# Patient Record
Sex: Female | Born: 1955 | Race: White | Hispanic: No | Marital: Married | Smoking: Never smoker
Health system: Southern US, Community
[De-identification: ages and names within clinical notes are randomized; demographics above are authoritative.]

## PROBLEM LIST (undated history)

## (undated) DIAGNOSIS — C44221 Squamous cell carcinoma of skin of unspecified ear and external auricular canal: Secondary | ICD-10-CM

## (undated) DIAGNOSIS — T8859XA Other complications of anesthesia, initial encounter: Secondary | ICD-10-CM

## (undated) DIAGNOSIS — Z9889 Other specified postprocedural states: Secondary | ICD-10-CM

## (undated) DIAGNOSIS — R51 Headache: Secondary | ICD-10-CM

## (undated) DIAGNOSIS — K219 Gastro-esophageal reflux disease without esophagitis: Secondary | ICD-10-CM

## (undated) DIAGNOSIS — T4145XA Adverse effect of unspecified anesthetic, initial encounter: Secondary | ICD-10-CM

## (undated) DIAGNOSIS — F988 Other specified behavioral and emotional disorders with onset usually occurring in childhood and adolescence: Secondary | ICD-10-CM

## (undated) DIAGNOSIS — J45909 Unspecified asthma, uncomplicated: Secondary | ICD-10-CM

## (undated) DIAGNOSIS — H269 Unspecified cataract: Secondary | ICD-10-CM

## (undated) DIAGNOSIS — F32A Depression, unspecified: Secondary | ICD-10-CM

## (undated) DIAGNOSIS — K635 Polyp of colon: Secondary | ICD-10-CM

## (undated) DIAGNOSIS — I10 Essential (primary) hypertension: Secondary | ICD-10-CM

## (undated) DIAGNOSIS — F329 Major depressive disorder, single episode, unspecified: Secondary | ICD-10-CM

## (undated) DIAGNOSIS — R112 Nausea with vomiting, unspecified: Secondary | ICD-10-CM

## (undated) HISTORY — PX: MOHS SURGERY: SUR867

## (undated) HISTORY — DX: Squamous cell carcinoma of skin of unspecified ear and external auricular canal: C44.221

## (undated) HISTORY — DX: Unspecified cataract: H26.9

## (undated) HISTORY — DX: Major depressive disorder, single episode, unspecified: F32.9

## (undated) HISTORY — PX: CHOLECYSTECTOMY: SHX55

## (undated) HISTORY — DX: Gastro-esophageal reflux disease without esophagitis: K21.9

## (undated) HISTORY — DX: Unspecified asthma, uncomplicated: J45.909

## (undated) HISTORY — PX: UPPER GASTROINTESTINAL ENDOSCOPY: SHX188

## (undated) HISTORY — DX: Polyp of colon: K63.5

## (undated) HISTORY — DX: Depression, unspecified: F32.A

## (undated) HISTORY — DX: Essential (primary) hypertension: I10

---

## 1993-11-25 HISTORY — PX: CHOLECYSTECTOMY: SHX55

## 1997-11-25 HISTORY — PX: NEUROPLASTY / TRANSPOSITION MEDIAN NERVE AT CARPAL TUNNEL BILATERAL: SUR894

## 1998-09-12 ENCOUNTER — Ambulatory Visit (HOSPITAL_COMMUNITY): Admission: RE | Admit: 1998-09-12 | Discharge: 1998-09-12 | Payer: Self-pay | Admitting: Emergency Medicine

## 2001-03-17 ENCOUNTER — Other Ambulatory Visit: Admission: RE | Admit: 2001-03-17 | Discharge: 2001-03-17 | Payer: Self-pay | Admitting: Gynecology

## 2001-10-08 ENCOUNTER — Encounter: Payer: Self-pay | Admitting: Otolaryngology

## 2001-10-08 ENCOUNTER — Encounter: Admission: RE | Admit: 2001-10-08 | Discharge: 2001-10-08 | Payer: Self-pay | Admitting: Otolaryngology

## 2002-03-22 ENCOUNTER — Ambulatory Visit (HOSPITAL_COMMUNITY): Admission: RE | Admit: 2002-03-22 | Discharge: 2002-03-22 | Payer: Self-pay | Admitting: Gastroenterology

## 2003-07-13 ENCOUNTER — Encounter: Payer: Self-pay | Admitting: Pediatrics

## 2003-07-13 ENCOUNTER — Ambulatory Visit (HOSPITAL_COMMUNITY): Admission: RE | Admit: 2003-07-13 | Discharge: 2003-07-13 | Payer: Self-pay | Admitting: Pediatrics

## 2004-02-13 ENCOUNTER — Encounter: Admission: RE | Admit: 2004-02-13 | Discharge: 2004-03-23 | Payer: Self-pay | Admitting: Emergency Medicine

## 2005-08-19 ENCOUNTER — Ambulatory Visit (HOSPITAL_COMMUNITY): Admission: RE | Admit: 2005-08-19 | Discharge: 2005-08-19 | Payer: Self-pay | Admitting: Emergency Medicine

## 2006-10-01 ENCOUNTER — Ambulatory Visit (HOSPITAL_COMMUNITY): Admission: RE | Admit: 2006-10-01 | Discharge: 2006-10-01 | Payer: Self-pay | Admitting: Emergency Medicine

## 2007-05-26 ENCOUNTER — Other Ambulatory Visit: Admission: RE | Admit: 2007-05-26 | Discharge: 2007-05-26 | Payer: Self-pay | Admitting: Obstetrics & Gynecology

## 2008-09-07 ENCOUNTER — Ambulatory Visit (HOSPITAL_COMMUNITY): Admission: RE | Admit: 2008-09-07 | Discharge: 2008-09-07 | Payer: Self-pay | Admitting: Emergency Medicine

## 2009-09-28 ENCOUNTER — Ambulatory Visit (HOSPITAL_COMMUNITY): Admission: RE | Admit: 2009-09-28 | Discharge: 2009-09-28 | Payer: Self-pay | Admitting: Obstetrics and Gynecology

## 2011-01-16 ENCOUNTER — Other Ambulatory Visit: Payer: Self-pay | Admitting: Nurse Practitioner

## 2011-01-16 DIAGNOSIS — Z1231 Encounter for screening mammogram for malignant neoplasm of breast: Secondary | ICD-10-CM

## 2011-01-29 ENCOUNTER — Ambulatory Visit (HOSPITAL_COMMUNITY)
Admission: RE | Admit: 2011-01-29 | Discharge: 2011-01-29 | Disposition: A | Discharge status: Home / Self Care | Payer: Commercial Managed Care - PPO | Source: Ambulatory Visit | Attending: Nurse Practitioner | Admitting: Nurse Practitioner

## 2011-01-29 DIAGNOSIS — Z1231 Encounter for screening mammogram for malignant neoplasm of breast: Secondary | ICD-10-CM | POA: Insufficient documentation

## 2011-04-12 NOTE — Procedures (Signed)
[place]  Patient:    Laura Mccarthy, Laura Mccarthy Visit Number: [id number] MRN: [id number]          Service Type: END Location: ENDO Attending Physician:  Rich Brave Dictated by:   Florencia Reasons, M.D. Proc. Date: 03/23/02 Admit Date:  03/22/2002 Discharge Date: 03/22/2002   CC:         Reuben Likes, M.D.  Gloris Manchester. Lazarus Salines, M.D.   Procedure Report  PROCEDURE:  Twenty-four hour ambulatory esophageal pH monitoring.  INDICATION:  A 55 year old with chronic cough, questionable reflux.  FINDINGS:  A normal study.  DESCRIPTION OF PROCEDURE:  This procedure was performed using a 24-hour indwelling pH probe with the patient off Nexium at the time of treatment.  The probe was positioned based on antecedent measurements of the manometric location of the lower esophageal sphincter.  FINDINGS: 1. Proximal probe:  No reflux was noted in the proximal probe whatsoever    during the study. 2. Distal probe:  The distal probe generated a normal DeMeester score of 13.8    (normal less than 22.0), with a total of 0.2% time refluxing while upright    and 2.7% of the time refluxing while recumbent (normal was actually less    than 1.2%).  Only three episodes lasted over five minutes, the longest    being 8.6 minutes, and there were 17 total episodes.  Despite the total    reflux time while recumbent being above the normal range, the patients    overall composite score was within normal limits.  IMPRESSION:  Overall, this is an unremarkable study performed while off medication.  DISCUSSION:  It is difficult to know whether this exam was reflective of the patients normal situation.  If it is, reflux is unlikely to be accounting for her chronic cough.  However, the possibility remains that the patient is refluxing on a sporadic basis which was not picked up during todays study.  PLAN:  The patient will follow up with me in the office in the near  future. For the time being, I think it would be prudent to continue Nexium until things get better sorted out. Dictated by:   Florencia Reasons, M.D. Attending Physician:  Rich Brave DD:  03/25/02 TD:  03/27/02 Job: [phone] [id number]

## 2011-04-12 NOTE — Procedures (Signed)
[place]  Patient:    KAILEE, ESSMAN Visit Number: [id number] MRN: [id number]          Service Type: END Location: ENDO Attending Physician:  Rich Brave Dictated by:   Florencia Reasons, M.D. Proc. Date: 03/22/02 Admit Date:  03/22/2002 Discharge Date: 03/22/2002   CC:         Reuben Likes, M.D.  Gloris Manchester. Lazarus Salines, M.D.  Durwin Reges., M.D.   Procedure Report  PROCEDURE:  Esophageal manometry.  INDICATION:  A 55 year old with chronic cough and possible reflux disease. This is being done as a prelude to 24-hour ambulatory pH monitoring.  FINDINGS:  Essentially normal exam.  DESCRIPTION OF PROCEDURE:  The procedure was explained to the patient and performed as an outpatient by the endoscopy nurse.  The solid-state catheter was passed transnasally and pulled back in the sequential pull-through fashion.  FINDINGS: 1. Lower esophageal sphincter:  The LES had normal resting pressure of    30 mmHg, although the percent relaxation, 60%, is probably somewhat less    than normal, since the residual pressure of 9.5 mmHg was above the normal    range of 8 mm.  This, however, is not felt to likely be clinically    significant. 2. Esophageal body study:  Esophageal peristalsis was normal in terms of    amplitude and duration of contractions.  No aperistaltic, repetitive, or    spontaneous activity was noted. 3. Upper esophageal sphincter:  The UES appeared to have coordinated    relaxation with pharyngeal contractions and had a normal resting pressure    and normal peak pressures.  IMPRESSION:  Essentially normal esophageal motility study.  PLAN:  Proceed to ambulatory 24-hour pH study. Dictated by:   Florencia Reasons, M.D. Attending Physician:  Rich Brave DD:  03/25/02 TD:  03/27/02 Job: [phone] [id number]

## 2011-08-15 ENCOUNTER — Ambulatory Visit (INDEPENDENT_AMBULATORY_CARE_PROVIDER_SITE_OTHER): Payer: Commercial Managed Care - PPO | Admitting: Pediatrics

## 2011-08-15 DIAGNOSIS — Z23 Encounter for immunization: Secondary | ICD-10-CM

## 2011-12-27 ENCOUNTER — Ambulatory Visit: Payer: 59

## 2011-12-27 ENCOUNTER — Ambulatory Visit (INDEPENDENT_AMBULATORY_CARE_PROVIDER_SITE_OTHER): Payer: 59 | Admitting: Family Medicine

## 2011-12-27 VITALS — BP 133/79 | HR 84 | Temp 99.2°F | Resp 18 | Ht 64.0 in | Wt 238.8 lb

## 2011-12-27 DIAGNOSIS — R059 Cough, unspecified: Secondary | ICD-10-CM

## 2011-12-27 DIAGNOSIS — R05 Cough: Secondary | ICD-10-CM

## 2011-12-27 LAB — POCT CBC
Granulocyte percent: 66.5 %G (ref 37–80)
HCT, POC: 39.5 % (ref 37.7–47.9)
Hemoglobin: 12.3 g/dL (ref 12.2–16.2)
Lymph, poc: 1.9 (ref 0.6–3.4)
MCH, POC: 28.2 pg (ref 27–31.2)
MCHC: 31.1 g/dL — AB (ref 31.8–35.4)
MCV: 90.7 fL (ref 80–97)
MID (cbc): 0.4 (ref 0–0.9)
MPV: 8.4 fL (ref 0–99.8)
POC Granulocyte: 4.6 (ref 2–6.9)
POC LYMPH PERCENT: 27.3 %L (ref 10–50)
POC MID %: 6.2 %M (ref 0–12)
Platelet Count, POC: 298 10*3/uL (ref 142–424)
RBC: 4.36 M/uL (ref 4.04–5.48)
RDW, POC: 14.4 %
WBC: 6.9 10*3/uL (ref 4.6–10.2)

## 2011-12-27 MED ORDER — METHYLPREDNISOLONE 4 MG PO KIT: PACK | ORAL | Status: AC

## 2011-12-27 MED ORDER — ALBUTEROL SULFATE (2.5 MG/3ML) 0.083% IN NEBU
2.5000 mg | INHALATION_SOLUTION | Freq: Once | RESPIRATORY_TRACT | Status: AC
  Administered 2011-12-27: 2.5 mg via RESPIRATORY_TRACT

## 2011-12-27 MED ORDER — HYDROCODONE-HOMATROPINE 5-1.5 MG/5ML PO SYRP: 5.0000 mL | ORAL_SOLUTION | Freq: Four times a day (QID) | ORAL | Status: AC | PRN

## 2011-12-27 MED ORDER — LEVOFLOXACIN 500 MG PO TABS: 500.0000 mg | ORAL_TABLET | Freq: Every day | ORAL | Status: AC

## 2011-12-27 NOTE — Patient Instructions (Signed)

## 2011-12-27 NOTE — Progress Notes (Signed)
  Subjective:    Patient ID: Laura Mccarthy, female    DOB: Aug 15, 1956, 56 y.o.   MRN: [id number]  Cough This is a new problem. The current episode started in the past 7 days. The problem has been gradually worsening. The problem occurs constantly. The cough is non-productive. Associated symptoms include a fever, nasal congestion, rhinorrhea, shortness of breath and wheezing. Pertinent negatives include no chest pain, chills, ear pain, headaches or sore throat.  Fever  Associated symptoms include coughing and wheezing. Pertinent negatives include no chest pain, ear pain, headaches or sore throat.  Shortness of Breath Associated symptoms include a fever, rhinorrhea and wheezing. Pertinent negatives include no chest pain, ear pain, headaches or sore throat.   [employer] employee.  Had flu shot   Review of Systems  Constitutional: Positive for fever. Negative for chills.  HENT: Positive for rhinorrhea. Negative for ear pain and sore throat.   Respiratory: Positive for cough, shortness of breath and wheezing.   Cardiovascular: Negative for chest pain.  Neurological: Negative for headaches.       Objective:   Physical Exam  Constitutional: She is oriented to person, place, and time. She appears well-developed and well-nourished.  HENT:  Head: Normocephalic and atraumatic.  Right Ear: External ear normal.  Left Ear: External ear normal.  Eyes: Conjunctivae and EOM are normal. Pupils are equal, round, and reactive to light.  Neck: Normal range of motion. Neck supple.  Cardiovascular: Normal rate, regular rhythm and normal heart sounds.   Pulmonary/Chest: No respiratory distress. She has wheezes. She has rales.  Neurological: She is alert and oriented to person, place, and time.  Skin: Skin is warm and dry.  Psychiatric: She has a normal mood and affect. Her behavior is normal. Thought content normal.     [facility] reading (PRIMARY) by  Dr. Milus Glazier:  Peribronchial cuffing, no infiltrate.        Assessment & Plan:  56 year old woman with severe cough for one week after one month of upper respiratory symptoms consistent with bronchitis.

## 2012-01-03 ENCOUNTER — Telehealth: Payer: Self-pay

## 2012-01-03 NOTE — Telephone Encounter (Signed)
.  [facility] PT WOULD LIKE TO HAVE A RESCUE INHALER CALLED IN FOR HER PLEASE CALL PT AT [phone]  [facility] AT [facility]

## 2012-01-04 MED ORDER — ALBUTEROL SULFATE HFA 108 (90 BASE) MCG/ACT IN AERS: 2.0000 | INHALATION_SPRAY | Freq: Four times a day (QID) | RESPIRATORY_TRACT | Status: DC | PRN

## 2012-01-04 NOTE — Telephone Encounter (Signed)
Unable to reach patient by telephone.  E-prescribe albuterol inhaler per pt request.  Pt will need to RTC if sx persist.

## 2012-02-26 ENCOUNTER — Other Ambulatory Visit: Payer: Self-pay | Admitting: Nurse Practitioner

## 2012-02-26 DIAGNOSIS — Z1231 Encounter for screening mammogram for malignant neoplasm of breast: Secondary | ICD-10-CM

## 2012-03-18 ENCOUNTER — Ambulatory Visit (INDEPENDENT_AMBULATORY_CARE_PROVIDER_SITE_OTHER): Payer: Commercial Managed Care - PPO | Admitting: Family Medicine

## 2012-03-18 ENCOUNTER — Encounter: Payer: Self-pay | Admitting: Family Medicine

## 2012-03-18 VITALS — BP 140/90 | HR 80 | Temp 98.5°F | Ht 63.75 in | Wt 242.0 lb

## 2012-03-18 DIAGNOSIS — Z833 Family history of diabetes mellitus: Secondary | ICD-10-CM

## 2012-03-18 DIAGNOSIS — Z136 Encounter for screening for cardiovascular disorders: Secondary | ICD-10-CM

## 2012-03-18 DIAGNOSIS — Z0000 Encounter for general adult medical examination without abnormal findings: Secondary | ICD-10-CM

## 2012-03-18 DIAGNOSIS — Z1211 Encounter for screening for malignant neoplasm of colon: Secondary | ICD-10-CM

## 2012-03-18 DIAGNOSIS — J45909 Unspecified asthma, uncomplicated: Secondary | ICD-10-CM | POA: Insufficient documentation

## 2012-03-18 LAB — LIPID PANEL
Cholesterol: 199 mg/dL (ref 0–200)
HDL: 46 mg/dL (ref >39.00)
LDL Cholesterol: 127 mg/dL — ABNORMAL HIGH (ref 0–99)
VLDL: 26 mg/dL (ref 0.0–40.0)

## 2012-03-18 LAB — COMPREHENSIVE METABOLIC PANEL
Alkaline Phosphatase: 67 U/L (ref 39–117)
Creatinine, Ser: 0.8 mg/dL (ref 0.4–1.2)
Glucose, Bld: 79 mg/dL (ref 70–99)
Sodium: 138 mEq/L (ref 135–145)
Total Bilirubin: 0.6 mg/dL (ref 0.3–1.2)
Total Protein: 7.1 g/dL (ref 6.0–8.3)

## 2012-03-18 MED ORDER — MONTELUKAST SODIUM 10 MG PO TABS: 10.0000 mg | ORAL_TABLET | Freq: Every day | ORAL | Status: DC

## 2012-03-18 NOTE — Patient Instructions (Addendum)
Nice to meet you. We will call you with your lab results.  Make an appt to me in the couple of months- we can check your lung function at that time.

## 2012-03-18 NOTE — Progress Notes (Signed)
Subjective:    Patient ID: Laura Mccarthy, female    DOB: 09-26-1956, 56 y.o.   MRN: [id number]  HPI  55 yo here to establish care.  Asthma- never truly diagnosed but has intermittent issues with bronchitis. Has rescue inhaler, does not use it very frequently. Mild seasonal allergies, does not take anything for it.  Depression-  Well controlled on Wellbutrin and Celexa.  Sees Dr. Evelene Croon every 6 months. Denies any symptoms of anxiety or depression.  Well woman- sees PA at [facility]. Mammogram scheduled for next month. G0, no h/o abnormal pap smears.  Patient Active Problem List  Diagnoses  . Asthma  . Routine general medical examination at a health care facility   No past medical history on file. Past Surgical History  Procedure Date  . Cholecystectomy   . Neuroplasty / transposition median nerve at carpal tunnel bilateral    History  Substance Use Topics  . Smoking status: Never Smoker   . Smokeless tobacco: Not on file  . Alcohol Use: Not on file   Family History  Problem Relation Age of Onset  . Heart disease Mother   . Heart disease Father   . Diabetes Sister    No Known Allergies Current Outpatient Prescriptions on File Prior to Visit  Medication Sig Dispense Refill  . albuterol (PROVENTIL HFA;VENTOLIN HFA) 108 (90 BASE) MCG/ACT inhaler Inhale 2 puffs into the lungs every 6 (six) hours as needed for wheezing.  1 Inhaler  0  . buPROPion (WELLBUTRIN XL) 150 MG 24 hr tablet Take 3 by mouth every morning      . citalopram (CELEXA) 40 MG tablet Take 40 mg by mouth daily.      . montelukast (SINGULAIR) 10 MG tablet Take 1 tablet (10 mg total) by mouth at bedtime.  30 tablet  3   The PMH, PSH, Social History, Family History, Medications, and allergies have been reviewed in [facility], and have been updated if relevant.    Review of Systems    See HPI Patient reports no  vision/ hearing changes,anorexia, weight change, fever ,adenopathy, persistant / recurrent  hoarseness, swallowing issues, chest pain, edema,persistant / recurrent cough, hemoptysis, dyspnea(rest, exertional, paroxysmal nocturnal), gastrointestinal  bleeding (melena, rectal bleeding), abdominal pain, excessive heart burn, GU symptoms(dysuria, hematuria, pyuria, voiding/incontinence  Issues) syncope, focal weakness, severe memory loss, concerning skin lesions, depression, anxiety, abnormal bruising/bleeding, major joint swelling, breast masses or abnormal vaginal bleeding.    Objective:   Physical Exam BP 140/90  Pulse 80  Temp(Src) 98.5 F (36.9 C) (Oral)  Ht 5' 3.75" (1.619 m)  Wt 242 lb (109.77 kg)  BMI 41.87 kg/m2   General:  Well-developed,well-nourished,in no acute distress; alert,appropriate and cooperative throughout examination Head:  normocephalic and atraumatic.   Eyes:  vision grossly intact, pupils equal, pupils round, and pupils reactive to light.   Ears:  R ear normal and L ear normal.   Nose:  no external deformity.   Mouth:  good dentition.   Lungs:  Normal respiratory effort, chest expands symmetrically. Lungs are clear to auscultation, no crackles or wheezes. Heart:  Normal rate and regular rhythm. S1 and S2 normal without gallop, murmur, click, rub or other extra sounds. Msk:  No deformity or scoliosis noted of thoracic or lumbar spine.   Extremities:  No clubbing, cyanosis, edema, or deformity noted with normal full range of motion of all joints.   Neurologic:  alert & oriented X3 and gait normal.   Skin:  Intact without suspicious  lesions or rashes Psych:  Cognition and judgment appear intact. Alert and cooperative with normal attention span and concentration. No apparent delusions, illusions, hallucinations       Assessment & Plan:   1. Asthma  Stable, lungs clear on exam. Advised checking PFTs at her convenience. Start Singulair.   2. Routine general medical examination at a health care facility  Reviewed preventive care protocols, scheduled due  services, and updated immunizations Discussed nutrition, exercise, diet, and healthy lifestyle.  Comprehensive metabolic panel  3. Screening for ischemic heart disease  Lipid Panel  4. Screening for colon cancer  Fecal occult blood, imunochemical  5. Family history of diabetes mellitus  Hemoglobin A1c

## 2012-03-20 ENCOUNTER — Other Ambulatory Visit (INDEPENDENT_AMBULATORY_CARE_PROVIDER_SITE_OTHER): Payer: Commercial Managed Care - PPO | Admitting: Family Medicine

## 2012-03-20 DIAGNOSIS — Z833 Family history of diabetes mellitus: Secondary | ICD-10-CM

## 2012-03-20 LAB — HEMOGLOBIN A1C: Hgb A1c MFr Bld: 5.5 % (ref 4.6–6.5)

## 2012-03-25 ENCOUNTER — Ambulatory Visit (HOSPITAL_COMMUNITY)
Admission: RE | Admit: 2012-03-25 | Discharge: 2012-03-25 | Disposition: A | Discharge status: Home / Self Care | Payer: 59 | Source: Ambulatory Visit | Attending: Nurse Practitioner | Admitting: Nurse Practitioner

## 2012-03-25 DIAGNOSIS — Z1231 Encounter for screening mammogram for malignant neoplasm of breast: Secondary | ICD-10-CM | POA: Insufficient documentation

## 2012-05-19 ENCOUNTER — Encounter: Payer: Self-pay | Admitting: Family Medicine

## 2012-05-19 ENCOUNTER — Ambulatory Visit (INDEPENDENT_AMBULATORY_CARE_PROVIDER_SITE_OTHER): Payer: Commercial Managed Care - PPO | Admitting: Family Medicine

## 2012-05-19 VITALS — BP 128/78 | HR 76 | Temp 98.7°F | Wt 242.0 lb

## 2012-05-19 DIAGNOSIS — J45909 Unspecified asthma, uncomplicated: Secondary | ICD-10-CM

## 2012-05-19 NOTE — Progress Notes (Signed)
  Subjective:    Patient ID: Laura Mccarthy, female    DOB: 1956/01/30, 56 y.o.   MRN: [id number]  HPI  56 yo here for follow up asthma.  Asthma- never truly diagnosed but has intermittent issues with bronchitis. Has rescue inhaler, does not use it very frequently, mainly at night. Mild seasonal allergies, started Singulair two months ago. Feels it has improved her symptoms quite significantly.   No CP or SOB.  Patient Active Problem List  Diagnosis  . Asthma  . Routine general medical examination at a health care facility   No past medical history on file. Past Surgical History  Procedure Date  . Cholecystectomy   . Neuroplasty / transposition median nerve at carpal tunnel bilateral    History  Substance Use Topics  . Smoking status: Never Smoker   . Smokeless tobacco: Not on file  . Alcohol Use: Not on file   Family History  Problem Relation Age of Onset  . Heart disease Mother   . Heart disease Father   . Diabetes Sister    No Known Allergies Current Outpatient Prescriptions on File Prior to Visit  Medication Sig Dispense Refill  . albuterol (PROVENTIL HFA;VENTOLIN HFA) 108 (90 BASE) MCG/ACT inhaler Inhale 2 puffs into the lungs every 6 (six) hours as needed for wheezing.  1 Inhaler  0  . buPROPion (WELLBUTRIN XL) 150 MG 24 hr tablet Take 3 by mouth every morning      . citalopram (CELEXA) 40 MG tablet Take 40 mg by mouth daily.      . montelukast (SINGULAIR) 10 MG tablet Take 1 tablet (10 mg total) by mouth at bedtime.  30 tablet  3   The PMH, PSH, Social History, Family History, Medications, and allergies have been reviewed in [facility], and have been updated if relevant.    Review of Systems    See HPI  Objective:   Physical Exam BP 128/78  Pulse 76  Temp 98.7 F (37.1 C)  Wt 242 lb (109.77 kg)  General:  Well-developed,well-nourished,in no acute distress; alert,appropriate and cooperative throughout examination Head:  normocephalic and atraumatic.   Eyes:   vision grossly intact, pupils equal, pupils round, and pupils reactive to light.   Ears:  R ear normal and L ear normal.   Nose:  no external deformity.   Mouth:  good dentition.   Lungs:  Normal respiratory effort, chest expands symmetrically. Lungs are clear to auscultation, no crackles or wheezes. Heart:  Normal rate and regular rhythm. S1 and S2 normal without gallop, murmur, click, rub or other extra sounds. Msk:  No deformity or scoliosis noted of thoracic or lumbar spine.   Extremities:  No clubbing, cyanosis, edema, or deformity noted with normal full range of motion of all joints.   Neurologic:  alert & oriented X3 and gait normal.   Skin:  Intact without suspicious lesions or rashes Psych:  Cognition and judgment appear intact. Alert and cooperative with normal attention span and concentration. No apparent delusions, illusions, hallucinations       Assessment & Plan:   1. Asthma  Stable, lungs clear on exam. PFTs normal. Continue Singulair. Follow up as needed.

## 2012-08-10 ENCOUNTER — Telehealth: Payer: Self-pay

## 2012-08-10 NOTE — Telephone Encounter (Signed)
Triage Record Num: [id number] Operator: April Finney Patient Name: Laura Mccarthy Call Date & Time: 08/09/2012 3:07:56PM Patient Phone: [phone] PCP: Ruthe Mannan Patient Gender: Female PCP Fax : [phone] Patient DOB: 11/16/1956 Practice Name: [facility] Reason for Call: Caller: Adella/Patient; PCP: Ruthe Mannan (Family Practice); CB#: [phone]; Call regarding Vomiting and loose stools since last night. Denies diarrhea. Afebrile. Has been able to keep some fluids down. No s/sx of dehydration. No emergent symptoms. Home care advice given. Called in Phenergan per standing orders to [facility] [phone]. Protocol(s) Used: Nausea or Vomiting Recommended Outcome per Protocol: Provide Home/Self Care Reason for Outcome: New onset of 3-4 episodes vomiting or diarrhea following mild abdominal cramping Care Advice: ~ Call provider if symptoms do not improve after 24 hours of home care. Call provider immediately if develop severe pain, black, tarry stools, bloody stools, blood-streaked or coffee ground-looking vomitus, or abdomen swollen. ~ ~ SYMPTOM / CONDITION MANAGEMENT ~ CAUTIONS Go to the ED if you have developed signs and symptoms of dehydration such as very dry mouth and tongue; increased pulse rate at rest; no urine output for 8 hours or more; increasing weakness or drowsiness, or lightheadedness when

## 2012-08-31 NOTE — Telephone Encounter (Signed)
Can this encounter be closed?

## 2012-08-31 NOTE — Telephone Encounter (Signed)
Ok to close

## 2013-04-08 ENCOUNTER — Ambulatory Visit: Payer: 59

## 2013-04-12 ENCOUNTER — Encounter: Payer: Self-pay | Admitting: Family Medicine

## 2013-04-15 ENCOUNTER — Ambulatory Visit: Payer: 59

## 2013-05-05 ENCOUNTER — Other Ambulatory Visit: Payer: Self-pay | Admitting: Family Medicine

## 2013-05-05 DIAGNOSIS — Z1231 Encounter for screening mammogram for malignant neoplasm of breast: Secondary | ICD-10-CM

## 2013-05-12 ENCOUNTER — Ambulatory Visit (HOSPITAL_COMMUNITY)
Admission: RE | Admit: 2013-05-12 | Discharge: 2013-05-12 | Disposition: A | Discharge status: Home / Self Care | Payer: 59 | Source: Ambulatory Visit | Attending: Family Medicine | Admitting: Family Medicine

## 2013-05-12 DIAGNOSIS — Z1231 Encounter for screening mammogram for malignant neoplasm of breast: Secondary | ICD-10-CM | POA: Insufficient documentation

## 2014-02-18 ENCOUNTER — Encounter (INDEPENDENT_AMBULATORY_CARE_PROVIDER_SITE_OTHER): Payer: Self-pay | Admitting: General Surgery

## 2014-02-18 ENCOUNTER — Other Ambulatory Visit (INDEPENDENT_AMBULATORY_CARE_PROVIDER_SITE_OTHER): Payer: Self-pay | Admitting: General Surgery

## 2014-02-18 ENCOUNTER — Ambulatory Visit (INDEPENDENT_AMBULATORY_CARE_PROVIDER_SITE_OTHER): Payer: Commercial Managed Care - PPO | Admitting: General Surgery

## 2014-02-18 VITALS — BP 130/88 | HR 90 | Temp 98.2°F | Resp 18 | Ht 64.0 in | Wt 249.0 lb

## 2014-02-18 DIAGNOSIS — E66811 Obesity, class 1: Secondary | ICD-10-CM | POA: Insufficient documentation

## 2014-02-18 DIAGNOSIS — E669 Obesity, unspecified: Secondary | ICD-10-CM | POA: Insufficient documentation

## 2014-02-18 NOTE — Patient Instructions (Signed)
Congratulations on starting your journey to a healthier life! Over the next few weeks you will be undergoing tests (x-rays and labs) and seeing specialists to help evaluate you for weight loss surgery.  These tests and consultations with a psychologist and nutritionist are needed to prepare you for the lifestyle changes that lie ahead and are often required by insurance companies to approve you for surgery.   Pathway to Surgery:  Over the next few weeks -->Lab work -->Radiology tests   - Chest x-ray - make sure your lungs are normal before surgery  - Upper GI - you drink barium and pictures are taken as it travels down your  esophagus and into your stomach - looks for reflux and a hiatal hernia which may  need to repaired at the same time as your weight loss surgery  - Abdominal Ultrasound - looks at your gallbladder and liver  - Mammogram - up to date mammogram if you are a female -->EKG  -->Sleep study - if you are felt to be at high risk for obstructive sleep apnea -->H. Pylori breath test ([employer]ath[employer]ek) - you surgeon may order this test to see if you have  a bacteria (H pylori) in your stomach which makes you at higher risk to develop a  ulcer or inflammation of your stomach -->Nutrition consultation -->Psychologist consultation -->Other specialist consults - your surgeon may determine that you need to see a  specialist like a cardiologist or pulmnologist depending on your health history -->Watch EMMI video about your planned weight loss surgery  Two weeks prior to surgery  Go on the extremely low carb liquid diet - this will decrease the size of your liver  which will make surgery safer  Attend preoperative appointment with your surgeon  Attend preoperative surgery class  One week prior to surgery  No aspirin products.  Tylenol is acceptable   24 hours prior to surgery  No alcoholic beverages  Report fever greater than 100.5 or excessive nasal drainage suggesting infection  Continue  bariatric preop diet  Perform bowel prep if ordered  Do not eat or drink anything after midnight the night before surgery  Do not take any medications except those instructed by the anesthesiologist  Morning of surgery  Please arrive at the hospital at least 2 hours before your scheduled surgery time.  No makeup, fingernail polish or jewelry  Bring insurance cards with you  Bring your CPAP mask if you use this  

## 2014-02-19 ENCOUNTER — Encounter (INDEPENDENT_AMBULATORY_CARE_PROVIDER_SITE_OTHER): Payer: Self-pay | Admitting: General Surgery

## 2014-02-19 NOTE — Progress Notes (Signed)
Patient ID: Laura Mccarthy, female   DOB: 1956/09/02, 58 y.o.   MRN: [id number]  Chief Complaint  Patient presents with  . Obesity    New bari    HPI Laura Mccarthy is a 58 y.o. female.   HPI 59 year old morbidly obese Caucasian female referred by Dr. Deborra Medina for evaluation of weight loss surgery. The patient states that she's interested in weight loss surgery because she is concerned about her overall health. She states her sisters have diabetes and she is afraid of developing diabetes. She is also tired of dealing and struggling with her weight. She states that she is struggling with her weight since age 42. Since then despite numerous attempts for sustained weight loss she has been unsuccessful. She has tried Orvan Seen, [employer], Fen/Phen, TOPS diet, hypnosis, physician supervised weight loss with hormone injections.  She is interested in a sleeve gastrectomy because she has numerous friends and relatives who have had it and it had good outcomes.  Past Medical History  Diagnosis Date  . GERD (gastroesophageal reflux disease)   . Asthma   . Depression     Past Surgical History  Procedure Laterality Date  . Cholecystectomy    . Neuroplasty / transposition median nerve at carpal tunnel bilateral      Family History  Problem Relation Age of Onset  . Heart disease Mother   . Heart disease Father   . Diabetes Sister     Social History History  Substance Use Topics  . Smoking status: Never Smoker   . Smokeless tobacco: Not on file  . Alcohol Use: Yes     Comment: occ    No Known Allergies  Current Outpatient Prescriptions  Medication Sig Dispense Refill  . amphetamine-dextroamphetamine (ADDERALL) 15 MG tablet Take 15 mg by mouth 2 (two) times daily.      Marland Kitchen albuterol (PROVENTIL HFA;VENTOLIN HFA) 108 (90 BASE) MCG/ACT inhaler Inhale 2 puffs into the lungs every 6 (six) hours as needed for wheezing.  1 Inhaler  0  . buPROPion (WELLBUTRIN XL) 150 MG 24 hr tablet Take 3 by mouth  every morning      . citalopram (CELEXA) 40 MG tablet Take 40 mg by mouth daily.      . montelukast (SINGULAIR) 10 MG tablet Take 1 tablet (10 mg total) by mouth at bedtime.  30 tablet  3   No current facility-administered medications for this visit.    Review of Systems Review of Systems  Constitutional: Negative for fever, activity change, appetite change and unexpected weight change.  HENT: Negative for nosebleeds and trouble swallowing.   Eyes: Negative for photophobia and visual disturbance.  Respiratory: Positive for cough and wheezing. Negative for chest tightness and shortness of breath (nonproductive).        Epworth sleepiness score 7  Cardiovascular: Negative for chest pain and leg swelling.       Denies CP, SOB, orthopnea, DOE; ?PND. Intermittent Left ankle edema  Gastrointestinal: Positive for constipation (Bmqo4days). Negative for nausea, vomiting, abdominal pain, diarrhea and blood in stool.       Does have some occasional reflux.   Genitourinary: Negative for dysuria and difficulty urinating.  Musculoskeletal: Negative for arthralgias.       Has had carpal tunnel surgery  Skin: Negative for pallor and rash.  Neurological: Positive for headaches. Negative for dizziness, seizures, facial asymmetry and numbness.       Denies TIA and amaurosis fugax   Hematological: Negative for adenopathy. Does not bruise/bleed  easily.  Psychiatric/Behavioral: Negative for behavioral problems and agitation.       Sees Dr Robina Ade for Depression    Blood pressure 130/88, pulse 90, temperature 98.2 F (36.8 C), temperature source Temporal, resp. rate 18, height 5\' 4"  (1.626 m), weight 249 lb (112.946 kg).  Physical Exam Physical Exam  Vitals reviewed. Constitutional: She is oriented to person, place, and time. She appears well-developed and well-nourished. No distress.  Morbidly obese  HENT:  Head: Normocephalic and atraumatic.  Right Ear: External ear normal.  Left Ear: External ear  normal.  Eyes: Conjunctivae are normal. No scleral icterus.  Neck: Normal range of motion. Neck supple. No tracheal deviation present. No thyromegaly present.  Cardiovascular: Normal rate and normal heart sounds.   Pulmonary/Chest: Effort normal and breath sounds normal. No stridor. No respiratory distress. She has no wheezes.  Abdominal: Soft. She exhibits no distension. There is no tenderness. There is no rebound and no guarding.  Well healed trocar sites  Musculoskeletal: She exhibits no edema and no tenderness.  Lymphadenopathy:    She has no cervical adenopathy.  Neurological: She is alert and oriented to person, place, and time. She exhibits normal muscle tone.  Skin: Skin is warm and dry. No rash noted. She is not diaphoretic. No erythema.  Psychiatric: She has a normal mood and affect. Her behavior is normal. Judgment and thought content normal.    Data Reviewed DG esophagus 2002 - small hiatal hernia, GERD mammo 04/2013 - wnl  Assessment    Morbid obesity BMI 42.74 Depression GERD (no meds) Asthma     Plan    The patient meets weight loss surgery criteria. I think the patient would be an acceptable candidate for Laparoscopic vertical sleeve gastrectomy.   We discussed laparoscopic sleeve gastrectomy. We discussed the preoperative, operative and postoperative process. Using diagrams, I explained the surgery in detail including the performance of an EGD near the end of the surgery and an Upper GI swallow study on POD 1. We discussed the typical hospital course including a 2-3 day stay baring any complications.   The patient was given educational material. I quoted the patient that most patients can lose up to 50-70% of their excess weight. We did discuss the possibility of weight regain several years after the procedure.  The risks of infection, bleeding, pain, scarring, weight regain, too little or too much weight loss, vitamin deficiencies and need for lifelong vitamin  supplementation, hair loss, need for protein supplementation, leaks, stricture, reflux, food intolerance, gallstone formation, hernia, need for reoperation and conversion to roux Y gastric bypass, need for open surgery, injury to spleen or surrounding structures, DVT's, PE, and death again discussed with the patient and the patient expressed understanding and desires to proceed with laparoscopic vertical sleeve gastrectomy, possible open, intraoperative endoscopy.  We discussed that before and after surgery that there would be an alteration in their diet. I explained that we have put them on a diet 2 weeks before surgery. I also explained that they would be on a liquid diet for 2 weeks after surgery. We discussed that they would have to avoid certain foods after surgery. We discussed the importance of physical activity as well as compliance with our dietary and supplement recommendations and routine follow-up.  I explained to the patient that we will start our evaluation process which includes labs, Upper GI to evaluate stomach and swallowing anatomy, nutritionist consultation, EKG, CXR, abdominal ultrasound, echocardiogram - because of phen/fen, clearance from her psychiatrist.  She was also given access to watch the EMMI video on sleeve gastrectomy.  Leighton Ruff. Redmond Pulling, MD, FACS General, Bariatric, & Minimally Invasive Surgery [facility], [place]         [facility] M 02/19/2014, 7:26 PM

## 2014-02-21 ENCOUNTER — Other Ambulatory Visit (INDEPENDENT_AMBULATORY_CARE_PROVIDER_SITE_OTHER): Payer: Self-pay | Admitting: General Surgery

## 2014-02-21 ENCOUNTER — Telehealth (INDEPENDENT_AMBULATORY_CARE_PROVIDER_SITE_OTHER): Payer: Self-pay | Admitting: General Surgery

## 2014-02-21 DIAGNOSIS — Z789 Other specified health status: Secondary | ICD-10-CM

## 2014-02-21 NOTE — Telephone Encounter (Signed)
LMOM for patient to let her know that she needs to have per Dr Redmond Pulling an 2D Echocardiogram with contrast, she is schedule 02-28-2014 @ 2:45 pm for an 3:00 pm apt at [facility] in section A at the [facility]. [phone]

## 2014-02-28 ENCOUNTER — Ambulatory Visit (HOSPITAL_COMMUNITY)
Admission: RE | Admit: 2014-02-28 | Discharge: 2014-02-28 | Disposition: A | Discharge status: Home / Self Care | Payer: 59 | Source: Ambulatory Visit | Attending: General Surgery | Admitting: General Surgery

## 2014-02-28 DIAGNOSIS — Z01810 Encounter for preprocedural cardiovascular examination: Secondary | ICD-10-CM

## 2014-02-28 DIAGNOSIS — Z0000 Encounter for general adult medical examination without abnormal findings: Secondary | ICD-10-CM | POA: Insufficient documentation

## 2014-02-28 DIAGNOSIS — Z789 Other specified health status: Secondary | ICD-10-CM

## 2014-02-28 DIAGNOSIS — J45909 Unspecified asthma, uncomplicated: Secondary | ICD-10-CM | POA: Insufficient documentation

## 2014-03-11 LAB — LIPID PANEL
CHOLESTEROL: 209 mg/dL — AB (ref 0–200)
HDL: 47 mg/dL (ref >39)
LDL Cholesterol: 144 mg/dL — ABNORMAL HIGH (ref 0–99)
TRIGLYCERIDES: 91 mg/dL (ref <150)
Total CHOL/HDL Ratio: 4.4 Ratio
VLDL: 18 mg/dL (ref 0–40)

## 2014-03-11 LAB — CBC
HEMATOCRIT: 39.9 % (ref 36.0–46.0)
HEMOGLOBIN: 13 g/dL (ref 12.0–15.0)
MCH: 26.9 pg (ref 26.0–34.0)
MCHC: 32.6 g/dL (ref 30.0–36.0)
MCV: 82.4 fL (ref 78.0–100.0)
Platelets: 340 10*3/uL (ref 150–400)
RBC: 4.84 MIL/uL (ref 3.87–5.11)
RDW: 15.7 % — ABNORMAL HIGH (ref 11.5–15.5)
WBC: 8.3 10*3/uL (ref 4.0–10.5)

## 2014-03-12 LAB — HEMOGLOBIN A1C
HEMOGLOBIN A1C: 5.4 % (ref <5.7)
MEAN PLASMA GLUCOSE: 108 mg/dL (ref <117)

## 2014-03-14 ENCOUNTER — Other Ambulatory Visit (INDEPENDENT_AMBULATORY_CARE_PROVIDER_SITE_OTHER): Payer: Self-pay | Admitting: General Surgery

## 2014-03-14 LAB — H. PYLORI ANTIBODY, IGG

## 2014-03-30 ENCOUNTER — Encounter: Payer: Self-pay | Admitting: Dietician

## 2014-03-30 ENCOUNTER — Encounter: Payer: 59 | Attending: General Surgery | Admitting: Dietician

## 2014-03-30 VITALS — Ht 65.0 in | Wt 249.2 lb

## 2014-03-30 DIAGNOSIS — Z01818 Encounter for other preprocedural examination: Secondary | ICD-10-CM | POA: Insufficient documentation

## 2014-03-30 DIAGNOSIS — Z713 Dietary counseling and surveillance: Secondary | ICD-10-CM | POA: Insufficient documentation

## 2014-03-30 NOTE — Progress Notes (Signed)
  Pre-Op Assessment Visit:  Pre-Operative Gastric sleeve Surgery  Medical Nutrition Therapy:  Appt start time: 1400   End time:  6415.  Patient was seen on 03/30/2014 for Pre-Operative Gastric sleeve Nutrition Assessment. Assessment and letter of approval faxed to [facility] Bariatric Surgery Program coordinator on 03/30/2014.   Preferred Learning Style:   No preference indicated   Learning Readiness:   Ready  Handouts given during visit include:  Pre-Op Goals Bariatric Surgery Protein Shakes  Teaching Method Utilized:  Visual Auditory  Barriers to learning/adherence to lifestyle change: none  Demonstrated degree of understanding via:  Teach Back   [facility] to [facility] the [facility] to enroll in Pre-Op and Post-Op Nutrition Education when surgery date is scheduled.

## 2014-04-08 ENCOUNTER — Other Ambulatory Visit (INDEPENDENT_AMBULATORY_CARE_PROVIDER_SITE_OTHER): Payer: Self-pay | Admitting: General Surgery

## 2014-04-08 ENCOUNTER — Other Ambulatory Visit (INDEPENDENT_AMBULATORY_CARE_PROVIDER_SITE_OTHER): Payer: Self-pay

## 2014-04-09 LAB — TSH: TSH: 2.663 u[IU]/mL (ref 0.350–4.500)

## 2014-04-11 LAB — H. PYLORI ANTIBODY, IGG

## 2014-04-13 ENCOUNTER — Encounter: Payer: Commercial Managed Care - PPO | Admitting: Family Medicine

## 2014-04-15 ENCOUNTER — Telehealth (INDEPENDENT_AMBULATORY_CARE_PROVIDER_SITE_OTHER): Payer: Self-pay | Admitting: General Surgery

## 2014-04-15 ENCOUNTER — Other Ambulatory Visit (INDEPENDENT_AMBULATORY_CARE_PROVIDER_SITE_OTHER): Payer: Self-pay | Admitting: General Surgery

## 2014-04-15 NOTE — Telephone Encounter (Signed)
Message copied by Maryclare Bean on Fri Apr 15, 2014  9:42 AM ------      Message from: Redmond Pulling, ERIC M      Created: Fri Apr 15, 2014  9:22 AM       Deneise Lever - this is a bariatric pt who we keep having an issue with getting her CMET (comprehensive metabolic panel) ordered. It has been ordered twice before but has CLINIC COLLECT.  Please re-order CMET as lab collect and let pt know she will need to get this done at her convenience within the next week. ------

## 2014-04-15 NOTE — Telephone Encounter (Signed)
LMOM for patient that she will need to go back to get CMET done next week and I told her that she will need to go FASTING. And I told her that I was sorry that she had to go back to get lab work done again

## 2014-04-20 LAB — COMPREHENSIVE METABOLIC PANEL
ALBUMIN: 4.3 g/dL (ref 3.5–5.2)
ALT: 12 U/L (ref 0–35)
AST: 14 U/L (ref 0–37)
Alkaline Phosphatase: 68 U/L (ref 39–117)
BUN: 17 mg/dL (ref 6–23)
CALCIUM: 9.2 mg/dL (ref 8.4–10.5)
CO2: 27 meq/L (ref 19–32)
CREATININE: 0.94 mg/dL (ref 0.50–1.10)
Chloride: 103 mEq/L (ref 96–112)
Glucose, Bld: 81 mg/dL (ref 70–99)
POTASSIUM: 4.3 meq/L (ref 3.5–5.3)
Sodium: 141 mEq/L (ref 135–145)
Total Bilirubin: 0.6 mg/dL (ref 0.2–1.2)
Total Protein: 6.9 g/dL (ref 6.0–8.3)

## 2014-04-21 ENCOUNTER — Other Ambulatory Visit (INDEPENDENT_AMBULATORY_CARE_PROVIDER_SITE_OTHER): Payer: Self-pay | Admitting: General Surgery

## 2014-04-22 ENCOUNTER — Other Ambulatory Visit: Payer: Self-pay

## 2014-04-22 ENCOUNTER — Ambulatory Visit (HOSPITAL_COMMUNITY)
Admission: RE | Admit: 2014-04-22 | Discharge: 2014-04-22 | Disposition: A | Discharge status: Home / Self Care | Payer: 59 | Source: Ambulatory Visit | Attending: General Surgery | Admitting: General Surgery

## 2014-04-22 DIAGNOSIS — J45909 Unspecified asthma, uncomplicated: Secondary | ICD-10-CM | POA: Insufficient documentation

## 2014-05-05 ENCOUNTER — Encounter (INDEPENDENT_AMBULATORY_CARE_PROVIDER_SITE_OTHER): Payer: Self-pay | Admitting: General Surgery

## 2014-05-05 ENCOUNTER — Ambulatory Visit (INDEPENDENT_AMBULATORY_CARE_PROVIDER_SITE_OTHER): Payer: Commercial Managed Care - PPO | Admitting: General Surgery

## 2014-05-05 VITALS — BP 128/77 | HR 78 | Temp 98.6°F | Resp 14 | Ht 65.0 in | Wt 250.6 lb

## 2014-05-05 MED ORDER — OXYCODONE HCL 5 MG/5ML PO SOLN: 5.0000 mg | ORAL | Status: DC | PRN

## 2014-05-05 NOTE — Patient Instructions (Signed)
Clear liquids the day before surgery  Follow the preop diet Call with any questions Get pain medication prescription filled

## 2014-05-05 NOTE — Progress Notes (Signed)
Patient ID: Laura Mccarthy, female   DOB: 06/19/1956, 58 y.o.   MRN: [id number]  Chief Complaint  Patient presents with  . Bariatric Pre-op    HPI Laura Mccarthy is a 58 y.o. female.   HPI 58-year-old female comes in today for her preoperative appointment. I initially met her in 02/06/2014 for consideration for weight loss surgery. She is currently scheduled to undergo laparoscopic sleeve gastrectomy with possible hiatal hernia repair on June 30. She is getting ready to start her preoperative diet. She denies any changes since she was initially seen in the office. Her initial visit weight was 249 pounds. Past Medical History  Diagnosis Date  . GERD (gastroesophageal reflux disease)   . Asthma   . Depression     Past Surgical History  Procedure Laterality Date  . Cholecystectomy    . Neuroplasty / transposition median nerve at carpal tunnel bilateral      Family History  Problem Relation Age of Onset  . Heart disease Mother   . Heart disease Father   . Diabetes Sister     Social History History  Substance Use Topics  . Smoking status: Never Smoker   . Smokeless tobacco: Not on file  . Alcohol Use: Yes     Comment: occ    No Known Allergies  Current Outpatient Prescriptions  Medication Sig Dispense Refill  . amphetamine-dextroamphetamine (ADDERALL) 15 MG tablet Take 15 mg by mouth 2 (two) times daily.      . buPROPion (WELLBUTRIN XL) 150 MG 24 hr tablet Take 3 by mouth every morning      . citalopram (CELEXA) 40 MG tablet Take 40 mg by mouth daily.      . albuterol (PROVENTIL HFA;VENTOLIN HFA) 108 (90 BASE) MCG/ACT inhaler Inhale 2 puffs into the lungs every 6 (six) hours as needed for wheezing.  1 Inhaler  0  . montelukast (SINGULAIR) 10 MG tablet Take 1 tablet (10 mg total) by mouth at bedtime.  30 tablet  3  . oxyCODONE (ROXICODONE) 5 MG/5ML solution Take 5-10 mLs (5-10 mg total) by mouth every 4 (four) hours as needed for severe pain.  200 mL  0   No current  facility-administered medications for this visit.    Review of Systems Review of Systems  Constitutional: Negative for fever, activity change, appetite change and unexpected weight change.  HENT: Negative for nosebleeds and trouble swallowing.   Eyes: Negative for photophobia and visual disturbance.  Respiratory: Negative for chest tightness and shortness of breath.   Cardiovascular: Negative for chest pain and leg swelling.       Denies CP, SOB, orthopnea, PND, DOE  Gastrointestinal: Positive for constipation. Negative for nausea, vomiting, abdominal pain, diarrhea and abdominal distention.       Some reflux but no meds  Genitourinary: Negative for dysuria and difficulty urinating.  Musculoskeletal: Negative for arthralgias.  Skin: Negative for pallor and rash.  Neurological: Positive for headaches. Negative for dizziness, seizures, facial asymmetry and numbness.       Denies TIA and amaurosis fugax   Hematological: Negative for adenopathy. Does not bruise/bleed easily.  Psychiatric/Behavioral: Negative for behavioral problems and agitation.       Depression    Blood pressure 128/77, pulse 78, temperature 98.6 F (37 C), temperature source Temporal, resp. rate 14, height 5' 5" (1.651 m), weight 250 lb 9.6 oz (113.671 kg).  Physical Exam Physical Exam  Vitals reviewed. Constitutional: She is oriented to person, place, and time. She appears well-developed   and well-nourished. No distress.  Morbidly obese  HENT:  Head: Normocephalic and atraumatic.  Right Ear: External ear normal.  Left Ear: External ear normal.  Eyes: Conjunctivae are normal. No scleral icterus.  Neck: Normal range of motion. Neck supple. No tracheal deviation present. No thyromegaly present.  Cardiovascular: Normal rate and normal heart sounds.   Pulmonary/Chest: Effort normal and breath sounds normal. No stridor. No respiratory distress. She has no wheezes.  Abdominal: Soft. She exhibits no distension. There  is no tenderness. There is no rebound and no guarding.  Musculoskeletal: She exhibits no edema and no tenderness.  Lymphadenopathy:    She has no cervical adenopathy.  Neurological: She is alert and oriented to person, place, and time. She exhibits normal muscle tone.  Skin: Skin is warm and dry. No rash noted. She is not diaphoretic. No erythema.  Psychiatric: She has a normal mood and affect. Her behavior is normal. Judgment and thought content normal.    Data Reviewed My office note ugi - small hiatal hernia evaluation labs - wnl except total cholesterol 209; LDL 144 cxr - wnl  Assessment    Morbid obesity BMI 41.7 GERD Depression asthma  dislipidemia - no meds    Plan    We reviewed her preoperative workup today. We discussed the possibility of a hiatal hernia which was seen oon her upper GI. I explained that I would test for one during surgery and if there is a clinically significant one discovered were detected during surgery with the calibration balloon test and I would recommend going ahead and proceeding with hiatal hernia repair at that time in order to decrease her chance of reflux. We discussed that hiatal hernia repair would involve reapproximating her diaphragm muscles. We also discussed the postoperative course again. I answered all of her questions. We discussed importance of the preoperative diet in helping shrink the liver. She was given her prescription for postoperative pain medication-oxycodone-today.   Jilliane Kazanjian M. Anesha Hackert, MD, FACS General, Bariatric, & Minimally Invasive Surgery [facility] [place]        Marks Scalera M 05/05/2014, 5:32 PM    

## 2014-05-09 ENCOUNTER — Encounter: Payer: 59 | Attending: General Surgery

## 2014-05-09 VITALS — Ht 65.0 in | Wt 249.0 lb

## 2014-05-09 DIAGNOSIS — Z713 Dietary counseling and surveillance: Secondary | ICD-10-CM | POA: Insufficient documentation

## 2014-05-09 DIAGNOSIS — Z01818 Encounter for other preprocedural examination: Secondary | ICD-10-CM | POA: Insufficient documentation

## 2014-05-10 NOTE — Progress Notes (Signed)
  Pre-Operative Nutrition Class:  Appt start time: 830   End time:  930.  Patient was seen on 05/09/2014 for Pre-Operative Bariatric Surgery Education at [facility].   Surgery date: 05/24/2014 Surgery type: sleeve Start weight at [facility]: 249 on 03/30/2014 Weight today: 249 lbs  TANITA  BODY COMP RESULTS  05/09/2014   BMI (kg/m^2) 41.4   Fat Mass (lbs) 113   Fat Free Mass (lbs) 136   Total Body Water (lbs) 99.5   Samples given per MNT protocol. Patient educated on appropriate usage: Unjury protein powder (chocolate - qty 1) Lot #: [id number] Exp: 04/2015  Premier protein shake (strawberry - qty 1) Lot#: [id number] Exp: 10/2014  Bariatric Advantage calcium citrate (cinnamon - qty 1) Lot #: [id number] Exp: 06/2014  Bariactiv MVI (qty 1) Lot #: [id number] Exp: 03/2015   The following the learning objectives were met by the patient during this course:  Identify Pre-Op Dietary Goals and will begin 2 weeks pre-operatively  Identify appropriate sources of fluids and proteins   State protein recommendations and appropriate sources pre and post-operatively  Identify Post-Operative Dietary Goals and will follow for 2 weeks post-operatively  Identify appropriate multivitamin and calcium sources  Describe the need for physical activity post-operatively and will follow MD recommendations  State when to call healthcare [facility]bar Brede regarding medication questions or post-operative complications  Handouts given during class include:  Pre-Op Bariatric Surgery Diet Handout  Protein Shake Handout  Post-Op Bariatric Surgery Nutrition Handout  BELT Program Information Flyer  Support Group Information Flyer  [facility] Bariatric Supplements Price List  Follow-Up Plan: Patient will follow-up at [facility] 2 weeks post operatively for diet advancement per MD.

## 2014-05-11 ENCOUNTER — Encounter (HOSPITAL_COMMUNITY): Payer: Self-pay | Admitting: Pharmacy Technician

## 2014-05-17 ENCOUNTER — Encounter (HOSPITAL_COMMUNITY): Payer: Self-pay

## 2014-05-17 ENCOUNTER — Encounter (HOSPITAL_COMMUNITY)
Admission: RE | Admit: 2014-05-17 | Discharge: 2014-05-17 | Disposition: A | Discharge status: Home / Self Care | Payer: 59 | Source: Ambulatory Visit | Attending: General Surgery | Admitting: General Surgery

## 2014-05-17 DIAGNOSIS — Z01812 Encounter for preprocedural laboratory examination: Secondary | ICD-10-CM | POA: Insufficient documentation

## 2014-05-17 HISTORY — DX: Nausea with vomiting, unspecified: Z98.890

## 2014-05-17 HISTORY — DX: Nausea with vomiting, unspecified: R11.2

## 2014-05-17 HISTORY — DX: Adverse effect of unspecified anesthetic, initial encounter: T41.45XA

## 2014-05-17 HISTORY — DX: Headache: R51

## 2014-05-17 HISTORY — DX: Other complications of anesthesia, initial encounter: T88.59XA

## 2014-05-17 HISTORY — DX: Other specified behavioral and emotional disorders with onset usually occurring in childhood and adolescence: F98.8

## 2014-05-17 LAB — CBC WITH DIFFERENTIAL/PLATELET
BASOS ABS: 0.1 10*3/uL (ref 0.0–0.1)
BASOS PCT: 1 % (ref 0–1)
Eosinophils Absolute: 0.2 10*3/uL (ref 0.0–0.7)
Eosinophils Relative: 2 % (ref 0–5)
HCT: 42.4 % (ref 36.0–46.0)
Hemoglobin: 14 g/dL (ref 12.0–15.0)
LYMPHS PCT: 29 % (ref 12–46)
Lymphs Abs: 2.5 10*3/uL (ref 0.7–4.0)
MCH: 28.5 pg (ref 26.0–34.0)
MCHC: 33 g/dL (ref 30.0–36.0)
MCV: 86.2 fL (ref 78.0–100.0)
Monocytes Absolute: 0.6 10*3/uL (ref 0.1–1.0)
Monocytes Relative: 7 % (ref 3–12)
NEUTROS ABS: 5.3 10*3/uL (ref 1.7–7.7)
Neutrophils Relative %: 61 % (ref 43–77)
Platelets: 341 10*3/uL (ref 150–400)
RBC: 4.92 MIL/uL (ref 3.87–5.11)
RDW: 14.5 % (ref 11.5–15.5)
WBC: 8.7 10*3/uL (ref 4.0–10.5)

## 2014-05-17 LAB — COMPREHENSIVE METABOLIC PANEL
ALK PHOS: 86 U/L (ref 39–117)
ALT: 18 U/L (ref 0–35)
AST: 17 U/L (ref 0–37)
Albumin: 4.2 g/dL (ref 3.5–5.2)
BILIRUBIN TOTAL: 0.3 mg/dL (ref 0.3–1.2)
BUN: 21 mg/dL (ref 6–23)
CHLORIDE: 99 meq/L (ref 96–112)
CO2: 27 meq/L (ref 19–32)
Calcium: 9.8 mg/dL (ref 8.4–10.5)
Creatinine, Ser: 0.85 mg/dL (ref 0.50–1.10)
GFR calc Af Amer: 85 mL/min — ABNORMAL LOW (ref >90)
GFR calc non Af Amer: 74 mL/min — ABNORMAL LOW (ref >90)
Glucose, Bld: 99 mg/dL (ref 70–99)
Potassium: 4.4 mEq/L (ref 3.7–5.3)
Sodium: 140 mEq/L (ref 137–147)
Total Protein: 7.6 g/dL (ref 6.0–8.3)

## 2014-05-17 NOTE — Pre-Procedure Instructions (Addendum)
EKG 5'15/CXR 4'15 Epic.

## 2014-05-17 NOTE — Patient Instructions (Addendum)
20 Laura Mccarthy  05/17/2014   Your procedure is scheduled on:   05-24-2014  Enter through [facility] Entrance and follow signs to [name]. Arrive at     1100  AM .  Call this number if you have problems the morning of surgery: [phone]  Or Presurgical Testing [phone]([place]) For Living Will and/or Health Care Power Attorney Forms: please provide copy for your medical record,may bring AM of surgery(Forms should be already notarized -we do not provide this service). 05-17-14, No information preferred today).  Remember: Follow any bowel prep instructions per MD office.    Do not eat food:After Midnight.  May have clear liquids:up to 6 Hours before arrival. Nothing after : 0700 AM  Clear liquids include soda, tea, black coffee, apple or grape juice, broth.  Take these medicines the morning of surgery with A SIP OF WATER: Bupropion. Duloxetine.   Do not wear jewelry, make-up or nail polish.  Do not wear lotions, powders, or perfumes. You may wear deodorant.  Do not shave 48 hours(2 days) prior to first CHG shower(legs and under arms).(Shaving face and neck okay.)  Do not bring valuables to the hospital.(Hospital is not responsible for lost valuables).  Contacts, dentures or removable bridgework, body piercing, hair pins may not be worn into surgery.  Leave suitcase in the car. After surgery it may be brought to your room.  For patients admitted to the hospital, checkout time is 11:00 AM the day of discharge.(Restricted visitors-Any Persons displaying flu-like symptoms or illness).    Patients discharged the day of surgery will not be allowed to drive home. Must have responsible person with you x 24 hours once discharged.  Name and phone number of your driver: Richard Nawrot. Spouse [phone] cell  Special Instructions: CHG(Chlorhedine 4%-"Hibiclens","Betasept","Aplicare") Shower Use Special Wash: see special instructions.(avoid face and  genitals)    _________________________    [facility] - Preparing for Surgery Before surgery, you can play an important role.  Because skin is not sterile, your skin needs to be as free of germs as possible.  You can reduce the number of germs on your skin by washing with CHG (chlorahexidine gluconate) soap before surgery.  CHG is an antiseptic cleaner which kills germs and bonds with the skin to continue killing germs even after washing. Please DO NOT use if you have an allergy to CHG or antibacterial soaps.  If your skin becomes reddened/irritated stop using the CHG and inform your nurse when you arrive at Short Stay. Do not shave (including legs and underarms) for at least 48 hours prior to the first CHG shower.  You may shave your face/neck. Please follow these instructions carefully:  1.  Shower with CHG Soap the night before surgery and the  morning of Surgery.  2.  If you choose to wash your hair, wash your hair first as usual with your  normal  shampoo.  3.  After you shampoo, rinse your hair and body thoroughly to remove the  shampoo.                           4.  Use CHG as you would any other liquid soap.  You can apply chg directly  to the skin and wash                       Gently with a scrungie or clean washcloth.  5.  Apply  the CHG Soap to your body ONLY FROM THE NECK DOWN.   Do not use on face/ open                           Wound or open sores. Avoid contact with eyes, ears mouth and genitals (private parts).                       Wash face,  Genitals (private parts) with your normal soap.             6.  Wash thoroughly, paying special attention to the area where your surgery  will be performed.  7.  Thoroughly rinse your body with warm water from the neck down.  8.  DO NOT shower/wash with your normal soap after using and rinsing off  the CHG Soap.                9.  Pat yourself dry with a clean towel.            10.  Wear clean pajamas.            11.  Place clean sheets on  your bed the night of your first shower and do not  sleep with pets. Day of Surgery : Do not apply any lotions/deodorants the morning of surgery.  Please wear clean clothes to the hospital/surgery center.  FAILURE TO FOLLOW THESE INSTRUCTIONS MAY RESULT IN THE CANCELLATION OF YOUR SURGERY PATIENT SIGNATURE_________________________________  NURSE SIGNATURE__________________________________  ________________________________________________________________________

## 2014-05-24 ENCOUNTER — Inpatient Hospital Stay (HOSPITAL_COMMUNITY): Payer: 59 | Admitting: Certified Registered Nurse Anesthetist

## 2014-05-24 ENCOUNTER — Encounter (HOSPITAL_COMMUNITY): Payer: 59 | Admitting: Certified Registered Nurse Anesthetist

## 2014-05-24 ENCOUNTER — Inpatient Hospital Stay (HOSPITAL_COMMUNITY)
Admission: RE | Admit: 2014-05-24 | Discharge: 2014-05-26 | DRG: 621 | Disposition: A | Discharge status: Home / Self Care | Payer: 59 | Source: Ambulatory Visit | Attending: General Surgery | Admitting: General Surgery

## 2014-05-24 ENCOUNTER — Encounter (HOSPITAL_COMMUNITY)
Admission: RE | Disposition: A | Discharge status: Home / Self Care | Payer: Self-pay | Source: Ambulatory Visit | Attending: General Surgery

## 2014-05-24 ENCOUNTER — Encounter (HOSPITAL_COMMUNITY): Payer: Self-pay | Admitting: *Deleted

## 2014-05-24 DIAGNOSIS — Z01812 Encounter for preprocedural laboratory examination: Secondary | ICD-10-CM

## 2014-05-24 DIAGNOSIS — E66811 Obesity, class 1: Secondary | ICD-10-CM | POA: Diagnosis present

## 2014-05-24 DIAGNOSIS — K219 Gastro-esophageal reflux disease without esophagitis: Secondary | ICD-10-CM | POA: Diagnosis present

## 2014-05-24 DIAGNOSIS — Z833 Family history of diabetes mellitus: Secondary | ICD-10-CM

## 2014-05-24 DIAGNOSIS — Z9089 Acquired absence of other organs: Secondary | ICD-10-CM

## 2014-05-24 DIAGNOSIS — E669 Obesity, unspecified: Secondary | ICD-10-CM | POA: Diagnosis present

## 2014-05-24 DIAGNOSIS — Z6841 Body Mass Index (BMI) 40.0 and over, adult: Secondary | ICD-10-CM

## 2014-05-24 DIAGNOSIS — K2100 Gastro-esophageal reflux disease with esophagitis, without bleeding: Secondary | ICD-10-CM

## 2014-05-24 DIAGNOSIS — J45909 Unspecified asthma, uncomplicated: Secondary | ICD-10-CM

## 2014-05-24 DIAGNOSIS — Z79899 Other long term (current) drug therapy: Secondary | ICD-10-CM

## 2014-05-24 DIAGNOSIS — F329 Major depressive disorder, single episode, unspecified: Secondary | ICD-10-CM | POA: Diagnosis present

## 2014-05-24 DIAGNOSIS — F3289 Other specified depressive episodes: Secondary | ICD-10-CM | POA: Diagnosis present

## 2014-05-24 DIAGNOSIS — F331 Major depressive disorder, recurrent, moderate: Secondary | ICD-10-CM | POA: Diagnosis present

## 2014-05-24 DIAGNOSIS — Z8249 Family history of ischemic heart disease and other diseases of the circulatory system: Secondary | ICD-10-CM

## 2014-05-24 DIAGNOSIS — Z9884 Bariatric surgery status: Secondary | ICD-10-CM

## 2014-05-24 HISTORY — PX: UPPER GI ENDOSCOPY: SHX6162

## 2014-05-24 HISTORY — PX: LAPAROSCOPIC GASTRIC SLEEVE RESECTION: SHX5895

## 2014-05-24 LAB — HEMOGLOBIN AND HEMATOCRIT, BLOOD
HCT: 41.3 % (ref 36.0–46.0)
Hemoglobin: 13.4 g/dL (ref 12.0–15.0)

## 2014-05-24 SURGERY — GASTRECTOMY, SLEEVE, LAPAROSCOPIC
Anesthesia: General

## 2014-05-24 MED ORDER — BUPIVACAINE-EPINEPHRINE 0.25% -1:200000 IJ SOLN
INTRAMUSCULAR | Status: AC
  Filled 2014-05-24: qty 1

## 2014-05-24 MED ORDER — MIDAZOLAM HCL 2 MG/2ML IJ SOLN
INTRAMUSCULAR | Status: AC
  Filled 2014-05-24: qty 2

## 2014-05-24 MED ORDER — ACETAMINOPHEN 160 MG/5ML PO SOLN: 650.0000 mg | ORAL | Status: DC | PRN

## 2014-05-24 MED ORDER — PANTOPRAZOLE SODIUM 40 MG IV SOLR
40.0000 mg | INTRAVENOUS | Status: DC
  Administered 2014-05-24 – 2014-05-25 (×2): 40 mg via INTRAVENOUS
  Filled 2014-05-24 (×3): qty 40

## 2014-05-24 MED ORDER — HEPARIN SODIUM (PORCINE) 5000 UNIT/ML IJ SOLN
5000.0000 [IU] | INTRAMUSCULAR | Status: AC
  Administered 2014-05-24: 5000 [IU] via SUBCUTANEOUS
  Filled 2014-05-24: qty 1

## 2014-05-24 MED ORDER — ACETAMINOPHEN 160 MG/5ML PO SOLN
325.0000 mg | ORAL | Status: DC | PRN
Start: 2014-05-25 — End: 2014-05-26

## 2014-05-24 MED ORDER — SUCCINYLCHOLINE CHLORIDE 20 MG/ML IJ SOLN
INTRAMUSCULAR | Status: DC | PRN
  Administered 2014-05-24: 120 mg via INTRAVENOUS

## 2014-05-24 MED ORDER — OXYCODONE HCL 5 MG/5ML PO SOLN
5.0000 mg | ORAL | Status: DC | PRN
  Administered 2014-05-26: 10 mg via ORAL
  Filled 2014-05-24: qty 10

## 2014-05-24 MED ORDER — LIDOCAINE HCL (CARDIAC) 20 MG/ML IV SOLN
INTRAVENOUS | Status: AC
  Filled 2014-05-24: qty 5

## 2014-05-24 MED ORDER — FENTANYL CITRATE 0.05 MG/ML IJ SOLN
INTRAMUSCULAR | Status: AC
  Filled 2014-05-24: qty 5

## 2014-05-24 MED ORDER — FENTANYL CITRATE 0.05 MG/ML IJ SOLN
INTRAMUSCULAR | Status: DC | PRN
  Administered 2014-05-24: 100 ug via INTRAVENOUS
  Administered 2014-05-24 (×5): 50 ug via INTRAVENOUS

## 2014-05-24 MED ORDER — TISSEEL VH 10 ML EX KIT
PACK | CUTANEOUS | Status: DC | PRN
  Administered 2014-05-24: 2

## 2014-05-24 MED ORDER — DEXAMETHASONE SODIUM PHOSPHATE 10 MG/ML IJ SOLN
INTRAMUSCULAR | Status: DC | PRN
  Administered 2014-05-24: 10 mg via INTRAVENOUS

## 2014-05-24 MED ORDER — OXYCODONE HCL 5 MG/5ML PO SOLN: 5.0000 mg | ORAL | Status: DC | PRN

## 2014-05-24 MED ORDER — HYDROMORPHONE HCL PF 1 MG/ML IJ SOLN: 0.2500 mg | INTRAMUSCULAR | Status: DC | PRN

## 2014-05-24 MED ORDER — UNJURY CHOCOLATE CLASSIC POWDER
2.0000 [oz_av] | Freq: Four times a day (QID) | ORAL | Status: DC
  Administered 2014-05-26: 2 [oz_av] via ORAL

## 2014-05-24 MED ORDER — 0.9 % SODIUM CHLORIDE (POUR BTL) OPTIME
TOPICAL | Status: DC | PRN
  Administered 2014-05-24: 1000 mL

## 2014-05-24 MED ORDER — KETAMINE HCL 10 MG/ML IJ SOLN
INTRAMUSCULAR | Status: DC | PRN
  Administered 2014-05-24: 20 mg via INTRAVENOUS

## 2014-05-24 MED ORDER — CHLORHEXIDINE GLUCONATE CLOTH 2 % EX PADS: 6.0000 | MEDICATED_PAD | Freq: Once | CUTANEOUS | Status: DC

## 2014-05-24 MED ORDER — MIDAZOLAM HCL 5 MG/5ML IJ SOLN
INTRAMUSCULAR | Status: DC | PRN
  Administered 2014-05-24: 2 mg via INTRAVENOUS

## 2014-05-24 MED ORDER — UNJURY VANILLA POWDER: 2.0000 [oz_av] | Freq: Four times a day (QID) | ORAL | Status: DC

## 2014-05-24 MED ORDER — ACETAMINOPHEN 10 MG/ML IV SOLN
1000.0000 mg | Freq: Once | INTRAVENOUS | Status: AC
  Administered 2014-05-24: 1000 mg via INTRAVENOUS
  Filled 2014-05-24: qty 100

## 2014-05-24 MED ORDER — BUPIVACAINE-EPINEPHRINE (PF) 0.25% -1:200000 IJ SOLN
INTRAMUSCULAR | Status: AC
  Filled 2014-05-24: qty 30

## 2014-05-24 MED ORDER — ACETAMINOPHEN 160 MG/5ML PO SOLN: 325.0000 mg | ORAL | Status: DC | PRN

## 2014-05-24 MED ORDER — DEXTROSE 5 % IV SOLN
2.0000 g | INTRAVENOUS | Status: AC
  Administered 2014-05-24: 2 g via INTRAVENOUS

## 2014-05-24 MED ORDER — GLYCOPYRROLATE 0.2 MG/ML IJ SOLN
INTRAMUSCULAR | Status: DC | PRN
  Administered 2014-05-24: .8 mg via INTRAVENOUS

## 2014-05-24 MED ORDER — TISSEEL VH 10 ML EX KIT
PACK | CUTANEOUS | Status: AC
  Filled 2014-05-24: qty 1

## 2014-05-24 MED ORDER — LACTATED RINGERS IR SOLN
Status: DC | PRN
  Administered 2014-05-24: 3000 mL

## 2014-05-24 MED ORDER — ROCURONIUM BROMIDE 100 MG/10ML IV SOLN
INTRAVENOUS | Status: DC | PRN
  Administered 2014-05-24: 40 mg via INTRAVENOUS
  Administered 2014-05-24: 10 mg via INTRAVENOUS
  Administered 2014-05-24 (×2): 5 mg via INTRAVENOUS

## 2014-05-24 MED ORDER — ENOXAPARIN SODIUM 40 MG/0.4ML ~~LOC~~ SOLN
40.0000 mg | Freq: Two times a day (BID) | SUBCUTANEOUS | Status: DC
  Administered 2014-05-25 – 2014-05-26 (×3): 40 mg via SUBCUTANEOUS
  Filled 2014-05-24 (×5): qty 0.4

## 2014-05-24 MED ORDER — PROPOFOL 10 MG/ML IV BOLUS
INTRAVENOUS | Status: AC
  Filled 2014-05-24: qty 20

## 2014-05-24 MED ORDER — MORPHINE SULFATE 2 MG/ML IJ SOLN
2.0000 mg | INTRAMUSCULAR | Status: DC | PRN
  Administered 2014-05-24: 2 mg via INTRAVENOUS
  Filled 2014-05-24: qty 1

## 2014-05-24 MED ORDER — KCL IN DEXTROSE-NACL 20-5-0.45 MEQ/L-%-% IV SOLN
INTRAVENOUS | Status: DC
  Administered 2014-05-24: 18:00:00 via INTRAVENOUS
  Administered 2014-05-25: 125 mL via INTRAVENOUS
  Administered 2014-05-25 – 2014-05-26 (×3): via INTRAVENOUS
  Filled 2014-05-24 (×7): qty 1000

## 2014-05-24 MED ORDER — ONDANSETRON HCL 4 MG/2ML IJ SOLN
INTRAMUSCULAR | Status: DC | PRN
  Administered 2014-05-24: 4 mg via INTRAVENOUS

## 2014-05-24 MED ORDER — HYDROMORPHONE HCL PF 2 MG/ML IJ SOLN
INTRAMUSCULAR | Status: AC
  Filled 2014-05-24: qty 1

## 2014-05-24 MED ORDER — PROPOFOL 10 MG/ML IV BOLUS
INTRAVENOUS | Status: DC | PRN
  Administered 2014-05-24: 200 mg via INTRAVENOUS

## 2014-05-24 MED ORDER — LACTATED RINGERS IV SOLN
INTRAVENOUS | Status: DC | PRN
  Administered 2014-05-24 (×3): via INTRAVENOUS

## 2014-05-24 MED ORDER — FENTANYL CITRATE 0.05 MG/ML IJ SOLN
INTRAMUSCULAR | Status: AC
  Filled 2014-05-24: qty 2

## 2014-05-24 MED ORDER — NEOSTIGMINE METHYLSULFATE 10 MG/10ML IV SOLN
INTRAVENOUS | Status: DC | PRN
  Administered 2014-05-24: 5 mg via INTRAVENOUS

## 2014-05-24 MED ORDER — ACETAMINOPHEN 10 MG/ML IV SOLN
1000.0000 mg | Freq: Four times a day (QID) | INTRAVENOUS | Status: AC
  Administered 2014-05-24 – 2014-05-25 (×4): 1000 mg via INTRAVENOUS
  Filled 2014-05-24 (×5): qty 100

## 2014-05-24 MED ORDER — KCL IN DEXTROSE-NACL 20-5-0.45 MEQ/L-%-% IV SOLN
INTRAVENOUS | Status: AC
Start: 2014-05-24 — End: 2014-05-25
  Filled 2014-05-24: qty 1000

## 2014-05-24 MED ORDER — LIDOCAINE HCL (CARDIAC) 20 MG/ML IV SOLN
INTRAVENOUS | Status: DC | PRN
  Administered 2014-05-24: 100 mg via INTRAVENOUS

## 2014-05-24 MED ORDER — BUPIVACAINE-EPINEPHRINE 0.25% -1:200000 IJ SOLN
INTRAMUSCULAR | Status: DC | PRN
  Administered 2014-05-24: 45 mL

## 2014-05-24 MED ORDER — PROMETHAZINE HCL 25 MG/ML IJ SOLN: 6.2500 mg | INTRAMUSCULAR | Status: DC | PRN

## 2014-05-24 MED ORDER — OXYCODONE HCL 5 MG PO TABS: 5.0000 mg | ORAL_TABLET | Freq: Once | ORAL | Status: DC | PRN

## 2014-05-24 MED ORDER — ONDANSETRON HCL 4 MG/2ML IJ SOLN
4.0000 mg | INTRAMUSCULAR | Status: DC | PRN
  Administered 2014-05-24 – 2014-05-25 (×4): 4 mg via INTRAVENOUS
  Filled 2014-05-24 (×4): qty 2

## 2014-05-24 MED ORDER — MEPERIDINE HCL 50 MG/ML IJ SOLN: 6.2500 mg | INTRAMUSCULAR | Status: DC | PRN

## 2014-05-24 MED ORDER — HYDROMORPHONE HCL PF 1 MG/ML IJ SOLN
INTRAMUSCULAR | Status: DC | PRN
  Administered 2014-05-24: 1 mg via INTRAVENOUS
  Administered 2014-05-24 (×2): 0.5 mg via INTRAVENOUS

## 2014-05-24 MED ORDER — ONDANSETRON HCL 4 MG/2ML IJ SOLN
INTRAMUSCULAR | Status: AC
  Filled 2014-05-24: qty 2

## 2014-05-24 MED ORDER — DEXTROSE 5 % IV SOLN
INTRAVENOUS | Status: AC
  Filled 2014-05-24: qty 2

## 2014-05-24 MED ORDER — OXYCODONE HCL 5 MG/5ML PO SOLN
5.0000 mg | Freq: Once | ORAL | Status: DC | PRN
  Filled 2014-05-24: qty 5

## 2014-05-24 MED ORDER — UNJURY CHICKEN SOUP POWDER: 2.0000 [oz_av] | Freq: Four times a day (QID) | ORAL | Status: DC

## 2014-05-24 MED ORDER — NEOSTIGMINE METHYLSULFATE 10 MG/10ML IV SOLN
INTRAVENOUS | Status: AC
  Filled 2014-05-24: qty 1

## 2014-05-24 MED ORDER — GLYCOPYRROLATE 0.2 MG/ML IJ SOLN
INTRAMUSCULAR | Status: AC
  Filled 2014-05-24: qty 3

## 2014-05-24 MED ORDER — ROCURONIUM BROMIDE 100 MG/10ML IV SOLN
INTRAVENOUS | Status: AC
  Filled 2014-05-24: qty 1

## 2014-05-24 SURGICAL SUPPLY — 56 items
ADH SKN CLS APL DERMABOND .7 (GAUZE/BANDAGES/DRESSINGS) ×2
APL SRG 32X5 SNPLK LF DISP (MISCELLANEOUS) ×2
APPLICATOR COTTON TIP 6IN STRL (MISCELLANEOUS) IMPLANT
APPLIER CLIP ROT 10 11.4 M/L (STAPLE) ×3
APR CLP MED LRG 11.4X10 (STAPLE) ×2
BLADE SURG SZ11 CARB STEEL (BLADE) ×3 IMPLANT
CABLE HIGH FREQUENCY MONO STRZ (ELECTRODE) ×1 IMPLANT
CHLORAPREP W/TINT 26ML (MISCELLANEOUS) ×6 IMPLANT
CLIP APPLIE ROT 10 11.4 M/L (STAPLE) IMPLANT
DERMABOND ADVANCED (GAUZE/BANDAGES/DRESSINGS) ×1
DERMABOND ADVANCED .7 DNX12 (GAUZE/BANDAGES/DRESSINGS) ×2 IMPLANT
DEVICE SUT QUICK LOAD TK 5 (STAPLE) IMPLANT
DEVICE SUT TI-KNOT TK 5X26 (MISCELLANEOUS) IMPLANT
DEVICE SUTURE ENDOST 10MM (ENDOMECHANICALS) IMPLANT
DEVICE TROCAR PUNCTURE CLOSURE (ENDOMECHANICALS) ×1 IMPLANT
DRAPE CAMERA CLOSED 9X96 (DRAPES) ×2 IMPLANT
DRAPE UTILITY XL STRL (DRAPES) ×6 IMPLANT
ELECT REM PT RETURN 9FT ADLT (ELECTROSURGICAL) ×3
ELECTRODE REM PT RTRN 9FT ADLT (ELECTROSURGICAL) ×2 IMPLANT
GAUZE SPONGE 4X4 12PLY STRL (GAUZE/BANDAGES/DRESSINGS) IMPLANT
GLOVE BIOGEL M STRL SZ7.5 (GLOVE) ×3 IMPLANT
GOWN STRL REUS W/TWL XL LVL3 (GOWN DISPOSABLE) ×11 IMPLANT
HOLDER FOLEY CATH W/STRAP (MISCELLANEOUS) ×1 IMPLANT
HOVERMATT SINGLE USE (MISCELLANEOUS) ×3 IMPLANT
KIT BASIN OR (CUSTOM PROCEDURE TRAY) ×3 IMPLANT
NDL SPNL 22GX3.5 QUINCKE BK (NEEDLE) ×2 IMPLANT
NEEDLE SPNL 22GX3.5 QUINCKE BK (NEEDLE) ×3 IMPLANT
PACK UNIVERSAL I (CUSTOM PROCEDURE TRAY) ×3 IMPLANT
RELOAD BLUE (STAPLE) ×8 IMPLANT
RELOAD GOLD (STAPLE) IMPLANT
RELOAD GREEN (STAPLE) ×3 IMPLANT
SCISSORS LAP 5X35 DISP (ENDOMECHANICALS) IMPLANT
SCISSORS LAP 5X45 EPIX DISP (ENDOMECHANICALS) ×3 IMPLANT
SEALANT SURGICAL APPL DUAL CAN (MISCELLANEOUS) ×3 IMPLANT
SET IRRIG TUBING LAPAROSCOPIC (IRRIGATION / IRRIGATOR) ×3 IMPLANT
SHEARS CURVED HARMONIC AC 45CM (MISCELLANEOUS) ×3 IMPLANT
SLEEVE GASTRECTOMY 36FR VISIGI (MISCELLANEOUS) ×3 IMPLANT
SLEEVE XCEL OPT CAN 5 100 (ENDOMECHANICALS) ×9 IMPLANT
SOLUTION ANTI FOG 6CC (MISCELLANEOUS) ×3 IMPLANT
STAPLE ECHEON FLEX 60 POW ENDO (STAPLE) ×3 IMPLANT
SUT MNCRL AB 4-0 PS2 18 (SUTURE) ×3 IMPLANT
SUT SURGIDAC NAB ES-9 0 48 120 (SUTURE) IMPLANT
SUT VIC AB 0 CT1 27 (SUTURE) ×3
SUT VIC AB 0 CT1 27XBRD ANTBC (SUTURE) IMPLANT
SYRINGE 20CC LL (MISCELLANEOUS) ×6 IMPLANT
SYRINGE 60CC LL (MISCELLANEOUS) ×3 IMPLANT
TOWEL OR 17X26 10 PK STRL BLUE (TOWEL DISPOSABLE) ×3 IMPLANT
TOWEL OR NON WOVEN STRL DISP B (DISPOSABLE) ×3 IMPLANT
TRAY FOLEY CATH 14FRSI W/METER (CATHETERS) IMPLANT
TROCAR BLADELESS 15MM (ENDOMECHANICALS) ×1 IMPLANT
TROCAR BLADELESS OPT 5 100 (ENDOMECHANICALS) ×3 IMPLANT
TROCAR UNIVERSAL OPT 12M 100M (ENDOMECHANICALS) IMPLANT
TROCAR XCEL 12X100 BLDLESS (ENDOMECHANICALS) ×3 IMPLANT
TUBING CONNECTING 10 (TUBING) ×3 IMPLANT
TUBING ENDO SMARTCAP (MISCELLANEOUS) ×3 IMPLANT
TUBING FILTER THERMOFLATOR (ELECTROSURGICAL) ×3 IMPLANT

## 2014-05-24 NOTE — Op Note (Signed)
Name:  Laura Mccarthy MRN: [id number] Date of Surgery: 05/24/2014  Preop Diagnosis:  Morbid Obesity  Postop Diagnosis:  Morbid Obesity (Weight 250, BMI 41.7), S/P Gastric Sleeve  Procedure:  Upper endoscopy  (Intraoperative)  Surgeon:  Alphonsa Overall, M.D.  Anesthesia:  GET  Indications for procedure: Laura Mccarthy is a 58 y.o. female whose primary care physician is Arnette Norris, MD and has completed a Gastric Sleeve today by Dr. Redmond Pulling.  I am doing an intraoperative upper endoscopy to evaluate the gastric pouch.  Operative Note: The patient is under general anesthesia.  Dr. Redmond Pulling is laparoscoping the patient while I do an upper endoscopy to evaluate the stomach pouch.  With the patient intubated, I passed the Pentax upper endoscope without difficulty down the esophagus.  The esophago-gastric junction was at 38 cm.    The mucosa of the stomach looked viable and the staple line was intact without bleeding.  I advanced to the pylorus, but did not go through it.  While I insufflated the stomach pouch with air, Dr. Redmond Pulling  flooded the upper abdomen with saline to put the gastric pouch under saline.  There was no bubbling or evidence of a leak.   There was no evidence of narrowing of the pouch and the gastric sleeve looked tubular.  The scope was then withdrawn.  The esophagus was unremarkable and the patient tolerated the endoscopy without difficulty.  Alphonsa Overall, MD, [facility] Surgery Pager: [phone] Office phone:  [phone]

## 2014-05-24 NOTE — Transfer of Care (Signed)
Immediate Anesthesia Transfer of Care Note  Patient: Laura Mccarthy  Procedure(s) Performed: Procedure(s): LAPAROSCOPIC GASTRIC SLEEVE RESECTION UPPER ENDO (N/A) UPPER GI ENDOSCOPY  Patient Location: PACU  Anesthesia Type:General  Level of Consciousness: awake, alert  and patient cooperative  Airway & Oxygen Therapy: Patient Spontanous Breathing and Patient connected to face mask oxygen  Post-op Assessment: Report given to PACU RN, Post -op Vital signs reviewed and stable and Patient moving all extremities X 4  Post vital signs: Reviewed and stable  Complications: No apparent anesthesia complications

## 2014-05-24 NOTE — Op Note (Signed)
05/24/2014 Laura Mccarthy Nov 16, 1956 [id number]   PRE-OPERATIVE DIAGNOSIS:   Morbid obesity BMI 41.7  GERD  Depression  asthma  dislipidemia - no meds  POST-OPERATIVE DIAGNOSIS:  same  PROCEDURE:  Procedure(s): LAPAROSCOPIC SLEEVE GASTRECTOMY UPPER GI ENDOSCOPY  SURGEON:  Surgeon(s): Gayland Curry, MD FACS  ASSISTANTS: Alphonsa Overall MD FACS  ANESTHESIA:   general  DRAINS: none   BOUGIE: 38 fr ViSiGi  LOCAL MEDICATIONS USED:  MARCAINE     SPECIMEN:  Source of Specimen:  Greater curvature of stomach  DISPOSITION OF SPECIMEN:  PATHOLOGY  COUNTS:  YES  INDICATION FOR PROCEDURE: This is a very pleasant 58 year old morbidly obese WF who has had unsuccessful attempts for sustained weight loss. She presents today for a planned laparoscopic sleeve gastrectomy with upper endoscopy. We have discussed the risk and benefits of the procedure extensively preoperatively. Please see my separate notes.  PROCEDURE: After obtaining informed consent and receiving 5000 units of subcutaneous heparin, the patient was brought to the operating room at [facility] and placed supine on the operating room table. General endotracheal anesthesia was established. Sequential compression devices were placed. A Foley catheter was placed. A calibration tube was placed down the oropharynx into the stomach. The patient's abdomen was prepped and draped in the usual standard surgical fashion. She received preoperative IV antibiotics. A surgical timeout was performed. She received IV Tylenol.  Access to the abdomen was achieved using a 10 mm 0 laparoscope thru a 12 mm trocar In the left upper Quadrant 2 fingerbreadths below the left subcostal margin using the Optiview technique. Pneumoperitoneum was smoothly established up to 15 mm of mercury. The laparoscope was advanced and the abdominal cavity was surveilled. There were no unusual findings on laparoscopy.  A 5 mm trocar was placed slightly above and to the  left of the umbilicus under direct visualization. The patient was then placed in reverse Trendelenburg. The [facility] liver retractor was placed under the left lobe of the liver through a 5 mm trocar incision site in the subxiphoid position. A 5 mm trocar was placed in the lateral right upper quadrant along with a 15 mm trocar in the mid right abdomen  All under direct visualization after local had been infiltrated.  The stomach was inspected. It was completely decompressed. Because there was suggestions of a small hiatal hernia on her upper GI preoperatively, the calibration tubing had been placed into her stomach by the nurse anesthetist. 15 cc of air was instilled into the calibration tube balloon in the calibration tube was gently pulled back and there was resistance met at the GE junction. The air was taken out of the calibration tube and it was readvanced into the stomach. This time 10 cc of air was placed into the calibration tubing balloon and it was again pulled back toward the GE junction by the nurse anesthetist and there was adequate resistance therefore I felt there was no need to dissect out the left and right crus. Moreover there is no evidence of dimpling suggests a hiatal hernia. The calibration tubing was desufflated and removed from the patient. At this point the [place] tube was placed into the oropharynx and guided down into the esophagus. We identified the pylorus and measured 5-6 cm proximal to the pylorus and identified an area of where we would start taking down the short gastric vessels. Harmonic scalpel was used to take down the short gastric vessels along the greater curvature of the stomach. We were able to enter the  lesser sac. We continued to march along the greater curvature of the stomach taking down the short gastrics. As we approached the gastrosplenic ligament we took care in this area not to injure the spleen. We were able to take down the entire gastrosplenic ligament. We then  mobilized the fundus away from the left crus of diaphragm. There were several significant posterior gastric avascular attachments which were taken down with harmonic scalpel. This left the stomach completely mobilized. No vessels had been taken down along the lesser curvature of the stomach.  We then reidentified the pylorus. The ViSiGi was then re advanced and placed in the distal antrum and positioned along the lesser curvature. It was placed under suction which secured the 36Fr ViSiGi in place along the lesser curve. Then using the Ethicon echelon 60 mm stapler with a green load, I placed a stapler along the antrum approximately 6 cm from the pylorus. The stapler was angled so that there is ample room at the angularis incisura. I then fired the first staple load after inspecting it posteriorly to ensure adequate space both anteriorly and posteriorly. At this point I still was not completely past the angularis so with another green load, I placed the stapler in position just inside the prior stapleline. We then rotated the stomach to insure that there was adequate anteriorly as well as posteriorly. The stapler was then fired. At this point I started using blue load staple cartridges. The echelon stapler was then repositioned with a 60 mm blue load and we continued to march up along the [place]. My assistant was holding traction along the greater curvature stomach along the cauterized short gastric vessels ensuring that the stomach was symmetrically retracted. Prior to each firing of the staple, we rotated the stomach to ensure that there is adequate stomach left.  As we approached the fundus, the last firing of the stapler was lateral to the esophageal fat pad.  The sleeve was inspected. There is no evidence of cork screw. The staple line appeared hemostatic. There was a little bit of using along the staple line in the antrum and several ligamax  clips were placed.  The CRNA inflated the ViSiGi to the green zone  and the upper abdomen was flooded with saline. There were no bubbles. The sleeve was decompressed and the ViSiGi removed. My assistant scrubbed out and performed an upper endoscopy. The sleeve easily distended with air and the scope was easily advanced to the pylorus. There is no evidence of internal bleeding or cork screwing. There is no evidence of bubbles. Please see his operative note for further details. The gastric sleeve was decompressed and the endoscope was removed. Tisseel tissue sealant was applied along the entire length of the staple line. The greater curvature the stomach was grasped with a laparoscopic grasper and removed from the 15 mm trocar site.  The liver retractor was removed. I then closed the 15 mm trocar site with 1 interrupted 0 Vicryl sutures through the fascia using the endoclose. The closure was viewed laparoscopically and it was airtight. Pneumoperitoneum was released. All trocar sites were closed with a 4-0 Monocryl in a subcuticular fashion followed by the application of Dermabond. The Foley catheter was removed. The patient was extubated and taken to the recovery room in stable condition. All needle, instrument, and sponge counts were correct x2. There are no immediate complications  PLAN OF CARE: Admit to inpatient   PATIENT DISPOSITION:  PACU - hemodynamically stable.   Delay start of Pharmacological  VTE agent (>24hrs) due to surgical blood loss or risk of bleeding:  no  Leighton Ruff. Redmond Pulling, MD, FACS General, Bariatric, & Minimally Invasive Surgery [facility], [place]

## 2014-05-24 NOTE — Interval H&P Note (Signed)
History and Physical Interval Note:  05/24/2014 1:08 PM  Laura Mccarthy  has presented today for surgery, with the diagnosis of morbid obesity   The various methods of treatment have been discussed with the patient and family. After consideration of risks, benefits and other options for treatment, the patient has consented to  Procedure(s): LAPAROSCOPIC GASTRIC SLEEVE RESECTION POSSIBLE HIATAL HERNIA UPPER ENDO (N/A) as a surgical intervention .  The patient's history has been reviewed, patient examined, no change in status, stable for surgery.  I have reviewed the patient's chart and labs.  Questions were answered to the patient's satisfaction.    Leighton Ruff. Redmond Pulling, MD, [place], Bariatric, & Minimally Invasive Surgery [facility] Surgery, [place]   [facility] M

## 2014-05-24 NOTE — Anesthesia Preprocedure Evaluation (Addendum)
Anesthesia Evaluation  Patient identified by MRN, date of birth, ID band Patient awake    Reviewed: Allergy & Precautions, H&P , NPO status , Patient's Chart, lab work & pertinent test results  History of Anesthesia Complications (+) PONV and history of anesthetic complications  Airway Mallampati: II TM Distance: >3 FB Neck ROM: Full    Dental  (+) Dental Advisory Given   Pulmonary asthma ,  breath sounds clear to auscultation        Cardiovascular negative cardio ROS  Rhythm:Regular Rate:Normal     Neuro/Psych  Headaches, PSYCHIATRIC DISORDERS Depression    GI/Hepatic Neg liver ROS, GERD-  Medicated,  Endo/Other  Morbid obesity  Renal/GU negative Renal ROS     Musculoskeletal negative musculoskeletal ROS (+)   Abdominal   Peds  Hematology negative hematology ROS (+)   Anesthesia Other Findings   Reproductive/Obstetrics negative OB ROS                          Anesthesia Physical Anesthesia Plan  ASA: III  Anesthesia Plan: General   Post-op Pain Management:    Induction: Intravenous  Airway Management Planned: Oral ETT  Additional Equipment:   Intra-op Plan:   Post-operative Plan: Extubation in OR  Informed Consent: I have reviewed the patients History and Physical, chart, labs and discussed the procedure including the risks, benefits and alternatives for the proposed anesthesia with the patient or authorized representative who has indicated his/her understanding and acceptance.   Dental advisory given  Plan Discussed with: CRNA  Anesthesia Plan Comments:         Anesthesia Quick Evaluation

## 2014-05-24 NOTE — H&P (View-Only) (Signed)
Patient ID: Laura Mccarthy, female   DOB: Apr 15, 1956, 58 y.o.   MRN: [id number]  Chief Complaint  Patient presents with  . Bariatric Pre-op    HPI Laura Mccarthy is a 58 y.o. female.   HPI 58 year old female comes in today for her preoperative appointment. I initially met her in 02/06/2014 for consideration for weight loss surgery. She is currently scheduled to undergo laparoscopic sleeve gastrectomy with possible hiatal hernia repair on June 30. She is getting ready to start her preoperative diet. She denies any changes since she was initially seen in the office. Her initial visit weight was 249 pounds. Past Medical History  Diagnosis Date  . GERD (gastroesophageal reflux disease)   . Asthma   . Depression     Past Surgical History  Procedure Laterality Date  . Cholecystectomy    . Neuroplasty / transposition median nerve at carpal tunnel bilateral      Family History  Problem Relation Age of Onset  . Heart disease Mother   . Heart disease Father   . Diabetes Sister     Social History History  Substance Use Topics  . Smoking status: Never Smoker   . Smokeless tobacco: Not on file  . Alcohol Use: Yes     Comment: occ    No Known Allergies  Current Outpatient Prescriptions  Medication Sig Dispense Refill  . amphetamine-dextroamphetamine (ADDERALL) 15 MG tablet Take 15 mg by mouth 2 (two) times daily.      Marland Kitchen buPROPion (WELLBUTRIN XL) 150 MG 24 hr tablet Take 3 by mouth every morning      . citalopram (CELEXA) 40 MG tablet Take 40 mg by mouth daily.      Marland Kitchen albuterol (PROVENTIL HFA;VENTOLIN HFA) 108 (90 BASE) MCG/ACT inhaler Inhale 2 puffs into the lungs every 6 (six) hours as needed for wheezing.  1 Inhaler  0  . montelukast (SINGULAIR) 10 MG tablet Take 1 tablet (10 mg total) by mouth at bedtime.  30 tablet  3  . oxyCODONE (ROXICODONE) 5 MG/5ML solution Take 5-10 mLs (5-10 mg total) by mouth every 4 (four) hours as needed for severe pain.  200 mL  0   No current  facility-administered medications for this visit.    Review of Systems Review of Systems  Constitutional: Negative for fever, activity change, appetite change and unexpected weight change.  HENT: Negative for nosebleeds and trouble swallowing.   Eyes: Negative for photophobia and visual disturbance.  Respiratory: Negative for chest tightness and shortness of breath.   Cardiovascular: Negative for chest pain and leg swelling.       Denies CP, SOB, orthopnea, PND, DOE  Gastrointestinal: Positive for constipation. Negative for nausea, vomiting, abdominal pain, diarrhea and abdominal distention.       Some reflux but no meds  Genitourinary: Negative for dysuria and difficulty urinating.  Musculoskeletal: Negative for arthralgias.  Skin: Negative for pallor and rash.  Neurological: Positive for headaches. Negative for dizziness, seizures, facial asymmetry and numbness.       Denies TIA and amaurosis fugax   Hematological: Negative for adenopathy. Does not bruise/bleed easily.  Psychiatric/Behavioral: Negative for behavioral problems and agitation.       Depression    Blood pressure 128/77, pulse 78, temperature 98.6 F (37 C), temperature source Temporal, resp. rate 14, height 5' 5" (1.651 m), weight 250 lb 9.6 oz (113.671 kg).  Physical Exam Physical Exam  Vitals reviewed. Constitutional: She is oriented to person, place, and time. She appears well-developed  and well-nourished. No distress.  Morbidly obese  HENT:  Head: Normocephalic and atraumatic.  Right Ear: External ear normal.  Left Ear: External ear normal.  Eyes: Conjunctivae are normal. No scleral icterus.  Neck: Normal range of motion. Neck supple. No tracheal deviation present. No thyromegaly present.  Cardiovascular: Normal rate and normal heart sounds.   Pulmonary/Chest: Effort normal and breath sounds normal. No stridor. No respiratory distress. She has no wheezes.  Abdominal: Soft. She exhibits no distension. There  is no tenderness. There is no rebound and no guarding.  Musculoskeletal: She exhibits no edema and no tenderness.  Lymphadenopathy:    She has no cervical adenopathy.  Neurological: She is alert and oriented to person, place, and time. She exhibits normal muscle tone.  Skin: Skin is warm and dry. No rash noted. She is not diaphoretic. No erythema.  Psychiatric: She has a normal mood and affect. Her behavior is normal. Judgment and thought content normal.    Data Reviewed My office note ugi - small hiatal hernia evaluation labs - wnl except total cholesterol 209; LDL 144 cxr - wnl  Assessment    Morbid obesity BMI 41.7 GERD Depression asthma  dislipidemia - no meds    Plan    We reviewed her preoperative workup today. We discussed the possibility of a hiatal hernia which was seen oon her upper GI. I explained that I would test for one during surgery and if there is a clinically significant one discovered were detected during surgery with the calibration balloon test and I would recommend going ahead and proceeding with hiatal hernia repair at that time in order to decrease her chance of reflux. We discussed that hiatal hernia repair would involve reapproximating her diaphragm muscles. We also discussed the postoperative course again. I answered all of her questions. We discussed importance of the preoperative diet in helping shrink the liver. She was given her prescription for postoperative pain medication-oxycodone-today.   Leighton Ruff. Redmond Pulling, MD, FACS General, Bariatric, & Minimally Invasive Surgery [facility], [place]        [facility] M 05/05/2014, 5:32 PM

## 2014-05-25 ENCOUNTER — Encounter (HOSPITAL_COMMUNITY): Payer: Self-pay | Admitting: *Deleted

## 2014-05-25 ENCOUNTER — Inpatient Hospital Stay (HOSPITAL_COMMUNITY): Payer: 59

## 2014-05-25 LAB — CBC WITH DIFFERENTIAL/PLATELET
Basophils Absolute: 0 10*3/uL (ref 0.0–0.1)
Basophils Relative: 0 % (ref 0–1)
Eosinophils Absolute: 0 10*3/uL (ref 0.0–0.7)
Eosinophils Relative: 0 % (ref 0–5)
HEMATOCRIT: 40.6 % (ref 36.0–46.0)
Hemoglobin: 13.2 g/dL (ref 12.0–15.0)
LYMPHS ABS: 1.2 10*3/uL (ref 0.7–4.0)
LYMPHS PCT: 9 % — AB (ref 12–46)
MCH: 28.1 pg (ref 26.0–34.0)
MCHC: 32.5 g/dL (ref 30.0–36.0)
MCV: 86.4 fL (ref 78.0–100.0)
MONO ABS: 1 10*3/uL (ref 0.1–1.0)
Monocytes Relative: 8 % (ref 3–12)
Neutro Abs: 10.4 10*3/uL — ABNORMAL HIGH (ref 1.7–7.7)
Neutrophils Relative %: 83 % — ABNORMAL HIGH (ref 43–77)
PLATELETS: 306 10*3/uL (ref 150–400)
RBC: 4.7 MIL/uL (ref 3.87–5.11)
RDW: 14.5 % (ref 11.5–15.5)
WBC: 12.5 10*3/uL — AB (ref 4.0–10.5)

## 2014-05-25 LAB — COMPREHENSIVE METABOLIC PANEL
ALT: 28 U/L (ref 0–35)
AST: 23 U/L (ref 0–37)
Albumin: 3.8 g/dL (ref 3.5–5.2)
Alkaline Phosphatase: 76 U/L (ref 39–117)
BUN: 9 mg/dL (ref 6–23)
CO2: 27 meq/L (ref 19–32)
CREATININE: 0.72 mg/dL (ref 0.50–1.10)
Calcium: 8.8 mg/dL (ref 8.4–10.5)
Chloride: 97 mEq/L (ref 96–112)
GLUCOSE: 165 mg/dL — AB (ref 70–99)
Potassium: 5.1 mEq/L (ref 3.7–5.3)
SODIUM: 134 meq/L — AB (ref 137–147)
TOTAL PROTEIN: 7.1 g/dL (ref 6.0–8.3)
Total Bilirubin: 0.3 mg/dL (ref 0.3–1.2)

## 2014-05-25 LAB — HEMOGLOBIN AND HEMATOCRIT, BLOOD
HEMATOCRIT: 39 % (ref 36.0–46.0)
HEMOGLOBIN: 13 g/dL (ref 12.0–15.0)

## 2014-05-25 MED ORDER — PROMETHAZINE HCL 25 MG/ML IJ SOLN
12.5000 mg | Freq: Four times a day (QID) | INTRAMUSCULAR | Status: DC | PRN
  Administered 2014-05-25: 12.5 mg via INTRAVENOUS
  Filled 2014-05-25: qty 1

## 2014-05-25 MED ORDER — IOHEXOL 300 MG/ML  SOLN
50.0000 mL | Freq: Once | INTRAMUSCULAR | Status: AC | PRN
  Administered 2014-05-25: 50 mL via ORAL

## 2014-05-25 NOTE — Progress Notes (Signed)
1 Day Post-Op  Subjective: Nausea. Pain minimal. Walked.   Objective: Vital signs in last 24 hours: Temp:  [97.2 F (36.2 C)-98.3 F (36.8 C)] 97.7 F (36.5 C) (07/01 0626) Pulse Rate:  [90-106] 96 (07/01 0626) Resp:  [10-18] 18 (07/01 0626) BP: (138-176)/(76-89) 138/77 mmHg (07/01 0626) SpO2:  [94 %-100 %] 96 % (07/01 0626) Weight:  [235 lb 12.8 oz (106.958 kg)] 235 lb 12.8 oz (106.958 kg) (06/30 1050) Last BM Date: 05/22/14  Intake/Output from previous day: 06/30 0701 - 07/01 0700 In: 4435.4 [I.V.:4435.4] Out: 2470 [Urine:2470] Intake/Output this shift:    Alert, nad; nontoxic cta b/l Reg Soft, nd. Incision c/d/i. Incisional TTP No edema  Lab Results:   Recent Labs  05/24/14 1705 05/25/14 0520  WBC  --  12.5*  HGB 13.4 13.2  HCT 41.3 40.6  PLT  --  306   BMET  Recent Labs  05/25/14 0520  NA 134*  K 5.1  CL 97  CO2 27  GLUCOSE 165*  BUN 9  CREATININE 0.72  CALCIUM 8.8   PT/INR No results found for this basename: LABPROT, INR,  in the last 72 hours ABG No results found for this basename: PHART, PCO2, PO2, HCO3,  in the last 72 hours  Studies/Results: No results found.  Anti-infectives: Anti-infectives   Start     Dose/Rate Route Frequency Ordered Stop   05/24/14 1055  cefOXitin (MEFOXIN) 2 g in dextrose 5 % 50 mL IVPB     2 g 100 mL/hr over 30 Minutes Intravenous On call to O.R. 05/24/14 1055 05/24/14 1327      Assessment/Plan: s/p Procedure(s): LAPAROSCOPIC GASTRIC SLEEVE RESECTION UPPER ENDO (N/A) UPPER GI ENDOSCOPY  Looks good.  UGI this am. If ok will start POD 1 diet Cont VTE prophylaxis - scd, lovenox.  Ambulate, IS Add phenergan  Leighton Ruff. Redmond Pulling, MD, FACS General, Bariatric, & Minimally Invasive Surgery [facility] Surgery, [place]   LOS: 1 day    Gayland Curry 05/25/2014

## 2014-05-25 NOTE — Progress Notes (Signed)
Utilization review completed.  

## 2014-05-25 NOTE — Progress Notes (Signed)
Nutrition Education Note  Received consult for diet education per DROP protocol.   Discussed 2 week post op diet with pt. Emphasized that liquids must be non carbonated, non caffeinated, and sugar free. Fluid goals discussed. Pt to follow up with outpatient bariatric RD for further diet progression after 2 weeks. Multivitamins and minerals also reviewed. Teach back method used, pt expressed understanding, expect good compliance. Having slight nausea but states it's improved now. Diet questions answered.    Diet: First 2 Weeks  You will see the nutritionist about two (2) weeks after your surgery. The nutritionist will increase the types of foods you can eat if you are handling liquids well:  If you have severe vomiting or nausea and cannot handle clear liquids lasting longer than 1 day, call your surgeon  Protein Shake  Drink at least 2 ounces of shake 5-6 times per day  Each serving of protein shakes (usually 8 - 12 ounces) should have a minimum of:  15 grams of protein  And no more than 5 grams of carbohydrate  Goal for protein each day:  Men = 80 grams per day  Women = 60 grams per day  Protein powder may be added to fluids such as non-fat milk or Lactaid milk or Soy milk (limit to 35 grams added protein powder per serving)   Hydration  Slowly increase the amount of water and other clear liquids as tolerated (See Acceptable Fluids)  Slowly increase the amount of protein shake as tolerated  Sip fluids slowly and throughout the day  May use sugar substitutes in small amounts (no more than 6 - 8 packets per day; i.e. Splenda)   Fluid Goal  The first goal is to drink at least 8 ounces of protein shake/drink per day (or as directed by the nutritionist); some examples of protein shakes are [employer], [employer], EAS Edge HP, and Unjury. See handout from pre-op Bariatric Education Class:  Slowly increase the amount of protein shake you drink as tolerated  You may find it easier to  slowly sip shakes throughout the day  It is important to get your proteins in first  Your fluid goal is to drink 64 - 100 ounces of fluid daily  It may take a few weeks to build up to this  32 oz (or more) should be clear liquids  And  32 oz (or more) should be full liquids (see below for examples)  Liquids should not contain sugar, caffeine, or carbonation   Clear Liquids:  Water or Sugar-free flavored water (i.e. Fruit H2O, Propel)  Decaffeinated coffee or tea (sugar-free)  Crystal Lite, Wyler's Lite, Min[place] Maid Lite  Sugar-free Jell-O  Bouillon or broth  Sugar-free Popsicle: *Less than 20 calories each; Limit 1 per day   Full Liquids:  Protein Shakes/Drinks + 2 choices per day of other full liquids  Full liquids must be:  No More Than 12 grams of Carbs per serving  No More Than 3 grams of Fat per serving  Strained low-fat cream soup  Non-Fat milk  Fat-free Lactaid Milk  Sugar-free yogurt ([place]annon Lite & Fit, [place] yogurt)     [place], RD, [place] [phone] Pager [phone] Weekend/After Hours Pager

## 2014-05-25 NOTE — Progress Notes (Signed)
Patient alert and oriented, Post op day 1.  Provided support and encouragement.  Encouraged pulmonary toilet, ambulation and small sips of liquids once swallow study completed satisfactorily.  All questions answered.  Will continue to monitor.

## 2014-05-26 DIAGNOSIS — K219 Gastro-esophageal reflux disease without esophagitis: Secondary | ICD-10-CM | POA: Diagnosis present

## 2014-05-26 DIAGNOSIS — F331 Major depressive disorder, recurrent, moderate: Secondary | ICD-10-CM | POA: Diagnosis present

## 2014-05-26 DIAGNOSIS — F329 Major depressive disorder, single episode, unspecified: Secondary | ICD-10-CM | POA: Diagnosis present

## 2014-05-26 LAB — CBC WITH DIFFERENTIAL/PLATELET
BASOS ABS: 0 10*3/uL (ref 0.0–0.1)
Basophils Relative: 0 % (ref 0–1)
EOS PCT: 1 % (ref 0–5)
Eosinophils Absolute: 0.1 10*3/uL (ref 0.0–0.7)
HCT: 41.6 % (ref 36.0–46.0)
Hemoglobin: 13.6 g/dL (ref 12.0–15.0)
LYMPHS ABS: 1.8 10*3/uL (ref 0.7–4.0)
LYMPHS PCT: 14 % (ref 12–46)
MCH: 28.2 pg (ref 26.0–34.0)
MCHC: 32.7 g/dL (ref 30.0–36.0)
MCV: 86.1 fL (ref 78.0–100.0)
MONOS PCT: 9 % (ref 3–12)
Monocytes Absolute: 1.2 10*3/uL — ABNORMAL HIGH (ref 0.1–1.0)
NEUTROS PCT: 76 % (ref 43–77)
Neutro Abs: 9.7 10*3/uL — ABNORMAL HIGH (ref 1.7–7.7)
PLATELETS: 318 10*3/uL (ref 150–400)
RBC: 4.83 MIL/uL (ref 3.87–5.11)
RDW: 14.6 % (ref 11.5–15.5)
WBC: 12.8 10*3/uL — ABNORMAL HIGH (ref 4.0–10.5)

## 2014-05-26 MED ORDER — DULOXETINE HCL 60 MG PO CPEP
60.0000 mg | ORAL_CAPSULE | Freq: Every morning | ORAL | Status: DC
  Administered 2014-05-26: 60 mg via ORAL
  Filled 2014-05-26: qty 1

## 2014-05-26 MED ORDER — BUPROPION HCL 75 MG PO TABS: 150.0000 mg | ORAL_TABLET | Freq: Three times a day (TID) | ORAL | Status: DC

## 2014-05-26 MED ORDER — PANTOPRAZOLE SODIUM 40 MG PO TBEC: 40.0000 mg | DELAYED_RELEASE_TABLET | Freq: Every day | ORAL | Status: DC

## 2014-05-26 MED ORDER — BUPROPION HCL 75 MG PO TABS
150.0000 mg | ORAL_TABLET | Freq: Three times a day (TID) | ORAL | Status: DC
  Administered 2014-05-26: 150 mg via ORAL
  Filled 2014-05-26 (×3): qty 2

## 2014-05-26 MED ORDER — OXYCODONE HCL 5 MG/5ML PO SOLN: 5.0000 mg | ORAL | Status: DC | PRN

## 2014-05-26 NOTE — Discharge Summary (Signed)
Physician Discharge Summary  Laura Mccarthy KZS:[id number] DOB: 08-Mar-1956 DOA: 05/24/2014  PCP: Arnette Norris, MD  Admit date: 05/24/2014 Discharge date: 05/26/2014  Recommendations for Outpatient Follow-up:   Follow-up Information   Follow up with Gayland Curry, MD On 06/08/2014. (8:45 AM , For wound re-check)    Specialty:  General Surgery   Contact information:   [place] [address] [phone]      Discharge Diagnoses:  Principal Problem:   Obesity, Class III, BMI 40-49.9 (morbid obesity) Active Problems:   S/P laparoscopic sleeve gastrectomy   Depression   GERD (gastroesophageal reflux disease)   Surgical Procedure: Laparoscopic Sleeve Gastrectomy, upper endoscopy  Discharge Condition: Good Disposition: Home  Diet recommendation: Postoperative sleeve gastrectomy diet (liquids only)  Filed Weights   05/24/14 1050  Weight: 235 lb 12.8 oz (106.958 kg)     Hospital Course:  The patient was admitted for a planned laparoscopic sleeve gastrectomy. Please see operative note. Preoperatively the patient was given 5000 units of subcutaneous heparin for DVT prophylaxis. Postoperative prophylactic Lovenox dosing was started on the morning of postoperative day 1. The patient underwent an upper GI on postoperative day 1 which demonstrated no extravasation of contrast and emptying of the contrast into the Roux limb. The patient was started on ice chips and water which they tolerated. On postoperative day 2 The patient's diet was advanced to protein shakes which they also tolerated. The patient was ambulating without difficulty. Their vital signs are stable without fever or tachycardia. Their hemoglobin had remained stable. The patient had received discharge instructions and counseling. They were deemed stable for discharge.  She denied nausea and reflux on day of discharge  BP 150/87  Pulse 73  Temp(Src) 98.5 F (36.9 C) (Oral)  Resp 17  Ht 5\' 5"  (1.651 m)   Wt 235 lb 12.8 oz (106.958 kg)  BMI 39.24 kg/m2  SpO2 94%  Gen: alert, NAD, non-toxic appearing Pupils: equal, no scleral icterus Pulm: Lungs clear to auscultation, symmetric chest rise CV: regular rate and rhythm Abd: soft, mild appropriate tenderness, nondistended.  No cellulitis. No incisional hernia Ext: no edema, no calf tenderness Skin: no rash, no jaundice   Discharge Instructions      Discharge Instructions   Discharge instructions    Complete by:  As directed   See Bariatric Surgery Discharge Instructions     Increase activity slowly    Complete by:  As directed             Medication List    STOP taking these medications       aspirin-acetaminophen-caffeine 250-250-65 MG per tablet  Commonly known as:  EXCEDRIN MIGRAINE     buPROPion 150 MG 24 hr tablet  Commonly known as:  WELLBUTRIN XL  Replaced by:  buPROPion 75 MG tablet     ibuprofen 200 MG tablet  Commonly known as:  ADVIL,MOTRIN      TAKE these medications       acetaminophen 500 MG tablet  Commonly known as:  TYLENOL  Take 1,000 mg by mouth every 6 (six) hours as needed for mild pain or moderate pain.     amphetamine-dextroamphetamine 15 MG tablet  Commonly known as:  ADDERALL  Take 30 mg by mouth every morning.     buPROPion 75 MG tablet  Commonly known as:  WELLBUTRIN  Take 2 tablets (150 mg total) by mouth 3 (three) times daily.     DULoxetine 60 MG capsule  Commonly  known as:  CYMBALTA  Take 60 mg by mouth every morning.     oxyCODONE 5 MG/5ML solution  Commonly known as:  ROXICODONE  Take 5-10 mLs (5-10 mg total) by mouth every 4 (four) hours as needed for moderate pain or severe pain.     pantoprazole 40 MG tablet  Commonly known as:  PROTONIX  Take 1 tablet (40 mg total) by mouth daily.       Follow-up Information   Follow up with Gayland Curry, MD On 06/08/2014. (8:45 AM , For wound re-check)    Specialty:  General Surgery   Contact information:   [place] [address] [phone]        The results of significant diagnostics from this hospitalization (including imaging, microbiology, ancillary and laboratory) are listed below for reference.    Significant Diagnostic Studies: Dg Ugi W/water Sol Cm  05/25/2014   CLINICAL DATA:  58 year old female status post gastric sleeve. Postoperative evaluation.  EXAM: WATER SOLUBLE UPPER GI SERIES  TECHNIQUE: Single-column upper GI series was performed using water soluble contrast.  CONTRAST:  58mL OMNIPAQUE IOHEXOL 300 MG/ML  SOLN  COMPARISON:  Preoperative upper GI series 4 04/2014.  FLUOROSCOPY TIME:  2 min  FINDINGS: Preprocedural scout view of the abdomen. Surgical clips and staple line in the left upper quadrant. Stable right upper quadrant surgical clips.  Fluoroscopy images were lost due to power outage and subsequent modality malfunction.  Fluoroscopic evaluation during PO water-soluble oral contrast demonstrated no obstruction to the forward flow of contrast throughout the esophagus and into the stomach. No contrast leak or adverse features identified.  IMPRESSION: No leak or adverse features status post gastric sleeve.Fluoroscopy images were lost due to power outage and subsequent modality malfunction.   Electronically Signed   By: Lars Pinks M.D.   On: 05/25/2014 15:52    Labs: Basic Metabolic Panel:  Recent Labs Lab 05/25/14 0520  NA 134*  K 5.1  CL 97  CO2 27  GLUCOSE 165*  BUN 9  CREATININE 0.72  CALCIUM 8.8   Liver Function Tests:  Recent Labs Lab 05/25/14 0520  AST 23  ALT 28  ALKPHOS 76  BILITOT 0.3  PROT 7.1  ALBUMIN 3.8    CBC:  Recent Labs Lab 05/24/14 1705 05/25/14 0520 05/25/14 1616 05/26/14 0514  WBC  --  12.5*  --  12.8*  NEUTROABS  --  10.4*  --  9.7*  HGB 13.4 13.2 13.0 13.6  HCT 41.3 40.6 39.0 41.6  MCV  --  86.4  --  86.1  PLT  --  306  --  318    CBG: No results found for this basename: GLUCAP,  in the last 168  hours  Principal Problem:   Obesity, Class III, BMI 40-49.9 (morbid obesity) Active Problems:   S/P laparoscopic sleeve gastrectomy   Depression   GERD (gastroesophageal reflux disease)   Time coordinating discharge: 15 minutes  Signed:  Gayland Curry, MD [facility], [place] [phone] 05/26/2014, 12:26 PM

## 2014-05-26 NOTE — Discharge Instructions (Signed)

## 2014-05-26 NOTE — Progress Notes (Signed)
Patient alert and oriented, pain is controlled. Patient is tolerating fluids, advanced to protein shake today, tolerated well with no pain or nausea.  Reviewed Gastric sleeve discharge instructions with patient and patient is able to articulate understanding.  Provided information on BELT program, Support Group and [facility] outpatient pharmacy. All questions answered, will continue to monitor.

## 2014-05-26 NOTE — Progress Notes (Signed)
Assessment unchanged. Pt verbalized understanding of dc instructions through teach back. Encouraged to sign up w/My Chart. No scripts at dc. Discharged via wc to front entrance to meet awaiting vehicle to carry home. Accompanied by husband and NT.

## 2014-05-26 NOTE — Anesthesia Postprocedure Evaluation (Signed)
Anesthesia Post Note  Patient: Laura Mccarthy  Procedure(s) Performed: Procedure(s) (LRB): LAPAROSCOPIC GASTRIC SLEEVE RESECTION UPPER ENDO (N/A) UPPER GI ENDOSCOPY  Anesthesia type: General  Patient location: PACU  Post pain: Pain level controlled  Post assessment: Post-op Vital signs reviewed  Last Vitals: BP 156/89  Pulse 79  Temp(Src) 37.2 C (Oral)  Resp 16  Ht 5\' 5"  (1.651 m)  Wt 235 lb 12.8 oz (106.958 kg)  BMI 39.24 kg/m2  SpO2 94%  Post vital signs: Reviewed  Level of consciousness: sedated  Complications: No apparent anesthesia complications

## 2014-06-01 ENCOUNTER — Telehealth (INDEPENDENT_AMBULATORY_CARE_PROVIDER_SITE_OTHER): Payer: Self-pay

## 2014-06-01 NOTE — Telephone Encounter (Signed)
Pt called in asking about prescriptions that were sent to her pharmacy. Let her know protonix for reflux was sent. She asked why the Wellbutrin dosage was changed. Advised that her discharge instructions does show it was changed by Dr Redmond Pulling but it does not give a reason why. She states her old prescription was extended release and she was told that was not good for bariatric patients. Advised I wasn't sure if that was the reason for the switch. She states she will get the new prescription and discuss it next week at her post op appt.

## 2014-06-06 ENCOUNTER — Telehealth (HOSPITAL_COMMUNITY): Payer: Self-pay

## 2014-06-07 ENCOUNTER — Encounter: Payer: 59 | Attending: General Surgery

## 2014-06-07 VITALS — Ht 65.0 in | Wt 229.0 lb

## 2014-06-07 DIAGNOSIS — Z713 Dietary counseling and surveillance: Secondary | ICD-10-CM | POA: Insufficient documentation

## 2014-06-07 DIAGNOSIS — Z01818 Encounter for other preprocedural examination: Secondary | ICD-10-CM | POA: Insufficient documentation

## 2014-06-07 NOTE — Patient Instructions (Signed)
Patient to follow Phase 3A-Soft, High Protein Diet and follow-up at [facility] in 6 weeks for 2 months post-op nutrition visit for diet advancement. 

## 2014-06-07 NOTE — Progress Notes (Signed)
Bariatric Class:  Appt start time: 1530 end time:  1630.  2 Week Post-Operative Nutrition Class  Patient was seen on 06/07/2014 for Post-Operative Nutrition [facility].   Surgery date: 05/24/2014 Surgery type: sleeve Start weight at [facility]: 249 on 03/30/2014 Weight today: 229.0 lbs  Weight loss: 20 lbs  TANITA  BODY COMP RESULTS  05/09/2014 06/07/14   BMI (kg/m^2) 41.4 38.1   Fat Mass (lbs) 113 99.5   Fat Free Mass (lbs) 136 129.5   Total Body Water (lbs) 99.5 95.0    The following the learning objectives were met by the patient during this course:  Identifies Phase 3A (Soft, High Proteins) Dietary Goals and will begin from 2 weeks post-operatively to 2 months post-operatively  Identifies appropriate sources of fluids and proteins   States protein recommendations and appropriate sources post-operatively  Identifies the need for appropriate texture modifications, mastication, and bite sizes when consuming solids  Identifies appropriate multivitamin and calcium sources post-operatively  Describes the need for physical activity post-operatively and will follow MD recommendations  States when to call healthcare provider regarding medication questions or post-operative complications  Handouts given during class include:  Phase 3A: Soft, High Protein Diet Handout  Follow-Up Plan: Patient will follow-up at [facility] in 6 weeks for 2 month post-op nutrition visit for diet advancement per MD.

## 2014-06-08 ENCOUNTER — Ambulatory Visit (INDEPENDENT_AMBULATORY_CARE_PROVIDER_SITE_OTHER): Payer: Commercial Managed Care - PPO | Admitting: General Surgery

## 2014-06-08 ENCOUNTER — Encounter (INDEPENDENT_AMBULATORY_CARE_PROVIDER_SITE_OTHER): Payer: Self-pay | Admitting: General Surgery

## 2014-06-08 VITALS — BP 122/76 | HR 68 | Temp 98.8°F | Resp 14 | Ht 64.0 in | Wt 227.6 lb

## 2014-06-08 DIAGNOSIS — Z09 Encounter for follow-up examination after completed treatment for conditions other than malignant neoplasm: Secondary | ICD-10-CM

## 2014-06-08 NOTE — Patient Instructions (Signed)
Keep walking daily  Eating techniques 20-20-20 (30-30-30) 20 chews, 20 seconds between bites of food, 20 minutes to eat; sometimes you may need 30 chews, 30 seconds etc Use your nondominant hand to eat with Put fork down between bites of food Use a timer after swallowing to reinforce waiting 20-30 sec between bites of food Use a child/infant size utensil Try not to eat while watching TV

## 2014-06-08 NOTE — Progress Notes (Signed)
Subjective:     Patient ID: Laura Mccarthy, female   DOB: Apr 22, 1956, 58 y.o.   MRN: [id number]  HPI 58 year old female comes in today for her first postoperative visit after undergoing laparoscopic sleeve gastrectomy on 05/24/2014. She was in the hospital for 2 nights. She states that she is doing great. She met with the nutritionist yesterday and her diet was advanced. She tried some beans last night and she had no problems with that. She denies any nausea, vomiting, regurgitation or abdominal pain. She denies any fevers or chills. She denies any lightheadedness or dizziness. She reports normal urination and bowel movements. She denies any melena or hematochezia. She is walking several times a week. She reports that she is taking her supplements as directed all the calcium is a little bit challenging with respect to its timing  Review of Systems     Objective:   Physical Exam BP 122/76  Pulse 68  Temp(Src) 98.8 F (37.1 C)  Resp 14  Ht _0  (1.626 m)  Wt 227 lb 9.6 oz (103.239 kg)  BMI 39.05 kg/m2  Gen: alert, NAD, non-toxic appearing Pupils: equal, no scleral icterus Pulm: Lungs clear to auscultation, symmetric chest rise CV: regular rate and rhythm Abd: soft, nontender, nondistended. Well-healed trocar sites. No cellulitis. No incisional hernia Ext: no edema, no calf tenderness Skin: no rash, no jaundice     Assessment:     Status post laparoscopic sleeve gastrectomy 05/24/2014 Depression Asthma Dyslipidemia-no meds GERD     Plan:     Overall I think she is doing well. There appear to be no signs of complications. I congratulated her on her weight loss which is around 21.4 pounds. We we discussed proper eating techniques and behaviors and she was given Neurosurgeon. We discussed importance of proper food choices. We discussed the importance of routine exercise. I have asked her to start walking on a daily basis. We discussed the importance of supplement compliance. I  will see her back in about 3 weeks. She was given a note to return to work for next Wednesday.  Leighton Ruff. Redmond Pulling, MD, FACS General, Bariatric, & Minimally Invasive Surgery [facility], [place]

## 2014-06-22 ENCOUNTER — Ambulatory Visit (INDEPENDENT_AMBULATORY_CARE_PROVIDER_SITE_OTHER): Payer: Commercial Managed Care - PPO | Admitting: General Surgery

## 2014-06-27 ENCOUNTER — Telehealth (INDEPENDENT_AMBULATORY_CARE_PROVIDER_SITE_OTHER): Payer: Self-pay

## 2014-06-27 NOTE — Telephone Encounter (Signed)
Pt called stating she is back at work and doing well but noticed when she bends over she does have sore firm area below port site. No nausea. Tolerating diet.  No fever. No redness at site. Pt advised this may be a hematoma from surgery. Pt advised to monitor area and if it becomes red,warm to touch or more painful pt to call for sooner appt. Pt states she understands.

## 2014-06-28 ENCOUNTER — Other Ambulatory Visit: Payer: Self-pay | Admitting: Family Medicine

## 2014-06-28 DIAGNOSIS — Z1231 Encounter for screening mammogram for malignant neoplasm of breast: Secondary | ICD-10-CM

## 2014-06-28 NOTE — Telephone Encounter (Signed)
Attempted DROP discharge phone call, left message, no call back received.  Made discharge phone call to patient per DROP protocol. Asking the following questions.    1. Do you have someone to care for you now that you are home?   2. Are you having pain now that is not relieved by your pain medication?   3. Are you able to drink the recommended daily amount of fluids (48 ounces minimum/day) and protein (60-80 grams/day) as prescribed by the dietitian or nutritional counselor?   4. Are you taking the vitamins and minerals as prescribed?   5. Do you have the "on call" number to contact your surgeon if you have a problem or question?   6. Are your incisions free of redness, swelling or drainage? (If steri strips, address that these can fall off, shower as tolerated)  7. Have your bowels moved since your surgery?  If not, are you passing gas?   8. Are you up and walking 3-4 times per day?      1. Do you have an appointment made to see your surgeon in the next month?   2. Were you provided your discharge medications before your surgery or before you were discharged from the hospital and are you taking them without problem?   3. Were you provided phone numbers to the clinic/surgeon's office?   4. Did you watch the patient education video module in the (clinic, surgeon's office, etc.) before your surgery?  5. Do you have a discharge checklist that was provided to you in the hospital to reference with instructions on how to take care of yourself after surgery?   6. Did you see a dietitian or nutritional counselor while you were in the hospital?   7. Do you have an appointment to see a dietitian or nutritional counselor in the next month?

## 2014-07-04 ENCOUNTER — Encounter (INDEPENDENT_AMBULATORY_CARE_PROVIDER_SITE_OTHER): Payer: Self-pay | Admitting: General Surgery

## 2014-07-04 ENCOUNTER — Ambulatory Visit (INDEPENDENT_AMBULATORY_CARE_PROVIDER_SITE_OTHER): Payer: Commercial Managed Care - PPO | Admitting: General Surgery

## 2014-07-04 VITALS — BP 150/92 | HR 117 | Temp 98.6°F | Ht 64.0 in | Wt 215.0 lb

## 2014-07-04 DIAGNOSIS — Z9884 Bariatric surgery status: Secondary | ICD-10-CM

## 2014-07-04 NOTE — Progress Notes (Signed)
Subjective:     Patient ID: Laura Mccarthy, female   DOB: 12-Mar-1956, 58 y.o.   MRN: [id number]  HPI 58 year old female comes in for her 6 week appointment after undergoing laparoscopic sleeve gastrectomy on June 30. I last saw her on July 15. At that time her weight was 228 pounds. She states she is doing great. She denies any heartburn or indigestion. She denies any food intolerances. She denies any nausea or regurgitation. She reports daily bowel movements. She states the one issue that she has not been doing well with this daily exercising. She is walking occasionally. She states that she has had a lifelong issue with bilateral lower extremity achiness. It occurs both in the front and back of the legs, with and without activity, and at random times. She denies any difficulty with taking pills. She does have a tendency to forget to take her calcium. She has started taking Biotin. She is getting in about 60 ounces of liquid a day.  Review of Systems     Objective:   Physical Exam BP 150/92  Pulse 117  Temp(Src) 98.6 F (37 C)  Ht 5\' 4"  (1.626 m)  Wt 215 lb (97.523 kg)  BMI 36.89 kg/m2  Gen: alert, NAD, non-toxic appearing Pupils: equal, no scleral icterus Pulm: Lungs clear to auscultation, symmetric chest rise CV: regular rate and rhythm Abd: soft, nontender, nondistended. Well-healed trocar sites. No cellulitis. No incisional hernia Ext: no edema, no calf tenderness Skin: no rash, no jaundice     Assessment:     S/p sleeve gastrectomy 6/30     Plan:     Overall I think she is doing well. Her initial visit weight was 249 pounds. Her preoperative weight was 250 pounds. Total weight loss since surgery is an approximately 39 pounds. We discussed the importance of daily routine exercise in some form or fashion. We discussed swimming or water aerobics since she has difficulty with her lower extremities. We discussed low-impact weight lifting. We discussed the importance of taking her  calcium supplement and maybe setting reminders of her smartphone. Since she is 6 weeks out I will have her finish her Protonix course and not renew it. We discussed the importance of eating techniques and behavior. Followup in 3 months  Leighton Ruff. Redmond Pulling, MD, FACS General, Bariatric, & Minimally Invasive Surgery [facility] [place]  Note: This dictation was prepared with Dragon/digital dictation along with [facility] technology. Any transcriptional errors that result from this process are unintentional.

## 2014-07-04 NOTE — Patient Instructions (Signed)
Monitor Blood pressure Make a follow up appt with your PCP Start exercising more regularly - swimming, low weightlifting

## 2014-07-08 ENCOUNTER — Ambulatory Visit (HOSPITAL_COMMUNITY)
Admission: RE | Admit: 2014-07-08 | Discharge: 2014-07-08 | Disposition: A | Discharge status: Home / Self Care | Payer: 59 | Source: Ambulatory Visit | Attending: Family Medicine | Admitting: Family Medicine

## 2014-07-08 DIAGNOSIS — Z1231 Encounter for screening mammogram for malignant neoplasm of breast: Secondary | ICD-10-CM | POA: Insufficient documentation

## 2014-07-19 ENCOUNTER — Encounter: Payer: 59 | Attending: General Surgery | Admitting: Dietician

## 2014-07-19 DIAGNOSIS — Z01818 Encounter for other preprocedural examination: Secondary | ICD-10-CM | POA: Insufficient documentation

## 2014-07-19 DIAGNOSIS — Z713 Dietary counseling and surveillance: Secondary | ICD-10-CM | POA: Insufficient documentation

## 2014-07-19 NOTE — Progress Notes (Signed)
  Follow-up visit:  8 Weeks Post-Operative gastric sleeve Surgery  Medical Nutrition Therapy:  Appt start time: 515 end time:  545  Primary concerns today: Post-operative Bariatric Surgery Nutrition Management.  Laura Mccarthy returns today with a 16 lbs weight loss since last visit. She reports a lack of energy. Has had some sweet tea. Weighing herself 2-3x a day.  Surgery date: 05/24/2014 Surgery type: sleeve Start weight at [facility]: 249 on 03/30/2014 Weight today: 213.0 lbs Weight change: 16 lbs Total weight loss: 36 lbs  TANITA  BODY COMP RESULTS  05/09/2014 06/07/14 07/19/14   BMI (kg/m^2) 41.4 38.1 35.4   Fat Mass (lbs) 113 99.5 88   Fat Free Mass (lbs) 136 129.5 125   Total Body Water (lbs) 99.5 95.0 91.5   Preferred Learning Style:   No preference indicated   Learning Readiness:   Ready  24-hr recall: B ( AM): premier protein shake (30g) Snk (  AM): cottage cheese (10g)  L ( PM): roast beef and cheese (14g) Snk (PM): applesauce or SF pudding  D ( PM): 2-3 oz protein with pintos or green beans (14-21g) Snk ( PM):   Fluid intake: 40 oz of water and 11oz protein shake, occasional G2 Gatorade (occasioanl sweet tea) Estimated total protein intake: 60+g protein  Medications: see list Supplementation: taking, having trouble remembering to take Calcium  Using straws: no Drinking while eating: sometimes Hair loss: no Carbonated beverages: no N/V/D/C: none Dumping syndrome: none  Recent physical activity:  Cardio sculpt on Monday nights  Progress Towards Goal(s):  In progress.  Handouts given during visit include:  Phase 3B lean protein + non starchy vegetables   Nutritional Diagnosis:  Alvin-3.3 Overweight/obesity related to past poor dietary habits and physical inactivity as evidenced by patient w/ recent gastric sleeve surgery following dietary guidelines for continued weight loss.     Intervention:  Nutrition counseling provided.  Teaching Method Utilized:   Visual Auditory  Barriers to learning/adherence to lifestyle change: food preferences  Demonstrated degree of understanding via:  Teach Back   Monitoring/Evaluation:  Dietary intake, exercise, and body weight. Follow up in 1 months for 3 month post-op visit.

## 2014-07-19 NOTE — Patient Instructions (Addendum)
-  Have protein foods as snacks; continue keeping protein foods at work (string cheese, cottage cheese, boiled eggs, deli meat)  Goals:  Follow Phase 3B: High Protein + Non-Starchy Vegetables  Eat 3-6 small meals/snacks, every 3-5 hrs  Increase lean protein foods to meet 60g goal  Increase fluid intake to 64oz+ (G2 Gatorade, skim milk, Powerade Zero, lemon or lime juice added to water, hot herbal tea, SF popsicles)  Avoid drinking 15 minutes before, during and 30 minutes after eating  Aim for >30 min of physical activity daily  Limit carbs to 15 grams per meal  Weigh yourself 1x a week  Keep focusing on non-scale victories  1 oz of meat is about 7 grams of protein   TANITA  BODY COMP RESULTS  05/09/2014 06/07/14 07/19/14   BMI (kg/m^2) 41.4 38.1 35.4   Fat Mass (lbs) 113 99.5 88   Fat Free Mass (lbs) 136 129.5 125   Total Body Water (lbs) 99.5 95.0 91.5

## 2014-09-20 ENCOUNTER — Ambulatory Visit: Payer: Commercial Managed Care - PPO | Admitting: Dietician

## 2014-10-11 ENCOUNTER — Encounter: Payer: 59 | Attending: General Surgery | Admitting: Dietician

## 2014-10-11 VITALS — Ht 65.0 in | Wt 188.5 lb

## 2014-10-11 DIAGNOSIS — Z6831 Body mass index (BMI) 31.0-31.9, adult: Secondary | ICD-10-CM | POA: Insufficient documentation

## 2014-10-11 DIAGNOSIS — Z713 Dietary counseling and surveillance: Secondary | ICD-10-CM | POA: Diagnosis not present

## 2014-10-11 NOTE — Patient Instructions (Addendum)
-  Have protein foods as snacks; continue keeping protein foods at work (string cheese, cottage cheese, boiled eggs, beef jerky)  Goals:  Follow Phase 3B: High Protein + Non-Starchy Vegetables  Eat 3-6 small meals/snacks, every 3-5 hrs  Increase lean protein foods to meet 60g goal  Increase fluid intake to 64oz+ (G2 Gatorade, skim milk, Powerade Zero, lemon or lime juice added to water, hot herbal tea, SF popsicles)  Avoid drinking 15 minutes before, during and 30 minutes after eating  Aim for >30 min of physical activity daily  Plan to walk 3 x week for 40 minutes and do class on Monday  Eat protein foods first, then vegetables, and had some carbs (up to 15 g) per meal  Weigh yourself 1x a week  Keep focusing on non-scale victories  1 oz of meat is about 7 grams of protein  Surgery date: 05/24/2014 Surgery type: sleeve Start weight at [facility]: 249 on 03/30/2014 Weight today: 188.5 lbs  Weight change: 24.5 lbs Total weight loss: 60.5 lbs  TANITA  BODY COMP RESULTS  05/09/2014 06/07/14 07/19/14 10/11/14   BMI (kg/m^2) 41.4 38.1 35.4 31.4   Fat Mass (lbs) 113 99.5 88 71.0   Fat Free Mass (lbs) 136 129.5 125 117.5   Total Body Water (lbs) 99.5 95.0 91.5 86.0

## 2014-10-11 NOTE — Progress Notes (Signed)
  Follow-up visit:  5 Months Post-Operative gastric sleeve Surgery  Medical Nutrition Therapy:  Appt start time: 630 end time:  700  Primary concerns today: Post-operative Bariatric Surgery Nutrition Management.  Laura Mccarthy returns today with a 24.5 lbs weight loss since last visit. Feels like she isn't meals instead having mostly snack sized portion. Not having very much fluid.   She reports a lack of energy. Has had some sweet tea. Weighing herself 2-3x a day.  Can tolerate most foods including sugar.   Surgery date: 05/24/2014 Surgery type: sleeve Start weight at [facility]: 249 on 03/30/2014 Weight today: 188.5 lbs  Weight change: 24.5 lbs Total weight loss: 60.5 lbs Weight loss goal: 150 lbs  TANITA  BODY COMP RESULTS  05/09/2014 06/07/14 07/19/14 10/11/14   BMI (kg/m^2) 41.4 38.1 35.4 31.4   Fat Mass (lbs) 113 99.5 88 71.0   Fat Free Mass (lbs) 136 129.5 125 117.5   Total Body Water (lbs) 99.5 95.0 91.5 86.0   Preferred Learning Style:   No preference indicated   Learning Readiness:   Ready  24-hr recall: B ( AM): premier protein shake (30g) or egg and bacon on week Snk (  AM): almonds or beef jerky (5 g) L ( PM): if going out to eat egg drop soup or salad with corn and bean or pimento cheese and celery (7-10g) Snk (PM): almonds or beef jerky (5 g) or protein bar 2 x week (13g) D ( PM): 2-3 oz protein with pintos and salad or greens (14-21g) Snk ( PM): Dole dippers (fruit in chocolate)  Fluid intake: 30 oz of water and 11oz protein shake, occasional G2 Gatorade (1 x week), 1/2 sweet tea if out to lunch(2 x week) (abpit 40-50 oz fluid)  Estimated total protein intake: 60+g protein  Medications: see list Supplementation: taking, having trouble remembering to take Calcium  Using straws: not usually and not having gas pain Drinking while eating: sometimes Hair loss: Noticing some hair loss and taking biotin Carbonated beverages: no N/V/D/C: none Dumping syndrome:  none  Recent physical activity:  Cardio sculpt on Monday nights and walks at lunch sometimes  Progress Towards Goal(s):  In progress.   Nutritional Diagnosis:  [place]-3.3 Overweight/obesity related to past poor dietary habits and physical inactivity as evidenced by patient w/ recent gastric sleeve surgery following dietary guidelines for continued weight loss.     Intervention:  Nutrition counseling provided.  Goals:  Follow Phase 3B: High Protein + Non-Starchy Vegetables  Eat 3-6 small meals/snacks, every 3-5 hrs  Increase lean protein foods to meet 60g goal  Increase fluid intake to 64oz+ (G2 Gatorade, skim milk, Powerade Zero, lemon or lime juice added to water, hot herbal tea, SF popsicles)  Avoid drinking 15 minutes before, during and 30 minutes after eating  Aim for >30 min of physical activity daily  Plan to walk 3 x week for 40 minutes and do class on Monday  Eat protein foods first, then vegetables, and had some carbs (up to 15 g) per meal  Weigh yourself 1x a week  Keep focusing on non-scale victories  1 oz of meat is about 7 grams of protein   Teaching Method Utilized:  Visual Auditory  Barriers to learning/adherence to lifestyle change: food preferences  Demonstrated degree of understanding via:  Teach Back   Monitoring/Evaluation:  Dietary intake, exercise, and body weight. Follow up in 3 months for 5 month post-op visit.

## 2014-10-26 ENCOUNTER — Other Ambulatory Visit (INDEPENDENT_AMBULATORY_CARE_PROVIDER_SITE_OTHER): Payer: Self-pay

## 2014-10-26 DIAGNOSIS — Z9884 Bariatric surgery status: Secondary | ICD-10-CM

## 2014-11-03 LAB — CMP AND LIVER
ALBUMIN: 4.4 g/dL (ref 3.5–5.2)
ALK PHOS: 65 U/L (ref 39–117)
ALT: 23 U/L (ref 0–35)
AST: 20 U/L (ref 0–37)
BILIRUBIN INDIRECT: 0.4 mg/dL (ref 0.2–1.2)
BUN: 23 mg/dL (ref 6–23)
Bilirubin, Direct: 0.1 mg/dL (ref 0.0–0.3)
CO2: 27 mEq/L (ref 19–32)
CREATININE: 0.8 mg/dL (ref 0.50–1.10)
Calcium: 9.9 mg/dL (ref 8.4–10.5)
Chloride: 107 mEq/L (ref 96–112)
GLUCOSE: 66 mg/dL — AB (ref 70–99)
POTASSIUM: 5.4 meq/L — AB (ref 3.5–5.3)
Sodium: 144 mEq/L (ref 135–145)
Total Bilirubin: 0.5 mg/dL (ref 0.2–1.2)
Total Protein: 6.8 g/dL (ref 6.0–8.3)

## 2014-11-03 LAB — CBC WITH DIFFERENTIAL/PLATELET
BASOS PCT: 1 % (ref 0–1)
Basophils Absolute: 0.1 10*3/uL (ref 0.0–0.1)
EOS PCT: 2 % (ref 0–5)
Eosinophils Absolute: 0.2 10*3/uL (ref 0.0–0.7)
HEMATOCRIT: 42.2 % (ref 36.0–46.0)
Hemoglobin: 13.8 g/dL (ref 12.0–15.0)
Lymphocytes Relative: 22 % (ref 12–46)
Lymphs Abs: 1.8 10*3/uL (ref 0.7–4.0)
MCH: 30.3 pg (ref 26.0–34.0)
MCHC: 32.7 g/dL (ref 30.0–36.0)
MCV: 92.5 fL (ref 78.0–100.0)
MONO ABS: 0.7 10*3/uL (ref 0.1–1.0)
MONOS PCT: 8 % (ref 3–12)
MPV: 9.7 fL (ref 9.4–12.4)
NEUTROS ABS: 5.6 10*3/uL (ref 1.7–7.7)
Neutrophils Relative %: 67 % (ref 43–77)
Platelets: 296 10*3/uL (ref 150–400)
RBC: 4.56 MIL/uL (ref 3.87–5.11)
RDW: 14.6 % (ref 11.5–15.5)
WBC: 8.3 10*3/uL (ref 4.0–10.5)

## 2014-11-03 LAB — VITAMIN B12: Vitamin B-12: 693 pg/mL (ref 211–911)

## 2014-11-03 LAB — VITAMIN D 25 HYDROXY (VIT D DEFICIENCY, FRACTURES): Vit D, 25-Hydroxy: 30 ng/mL (ref 30–100)

## 2014-11-03 LAB — FOLATE: Folate: >20 ng/mL

## 2015-01-10 ENCOUNTER — Encounter: Payer: 59 | Attending: General Surgery | Admitting: Dietician

## 2015-01-10 VITALS — Ht 65.0 in | Wt 179.0 lb

## 2015-01-10 DIAGNOSIS — Z6829 Body mass index (BMI) 29.0-29.9, adult: Secondary | ICD-10-CM | POA: Diagnosis not present

## 2015-01-10 DIAGNOSIS — E669 Obesity, unspecified: Secondary | ICD-10-CM | POA: Insufficient documentation

## 2015-01-10 DIAGNOSIS — Z713 Dietary counseling and surveillance: Secondary | ICD-10-CM | POA: Diagnosis present

## 2015-01-10 NOTE — Progress Notes (Signed)
Follow-up visit:  7 Months Post-Operative gastric sleeve Surgery  Medical Nutrition Therapy:  Appt start time: 500 end time:  530  Primary concerns today: Post-operative Bariatric Surgery Nutrition Management. Laura Mccarthy returns today with a 9.5 lbs weight loss since last visit 3 months ago. Body fat % is now 35.3%. [place] and was using the treadmill and got off track recently. Weighs herself every 2-3 days. Energy level is better.   Feels like she isn't meals instead having mostly snack sized portion. Not having very much fluid.   Can tolerate most foods including sugar.   Surgery date: 05/24/2014 Surgery type: sleeve Start weight at [facility]: 249 on 03/30/2014 Weight today: 179.0 lbs  Weight change: 9.5 lbs Total weight loss: 69.5 lbs Weight loss goal: 150 lbs  TANITA  BODY COMP RESULTS  05/09/2014 06/07/14 07/19/14 10/11/14 01/10/15   BMI (kg/m^2) 41.4 38.1 35.4 31.4 29.8   Fat Mass (lbs) 113 99.5 88 71.0 63.0   Fat Free Mass (lbs) 136 129.5 125 117.5 116.0   Total Body Water (lbs) 99.5 95.0 91.5 86.0 85.0   Preferred Learning Style:   No preference indicated   Learning Readiness:   Ready  24-hr recall: B ( AM): premier protein shake (30g) or egg and bacon on weekend Snk (  AM): almonds or beef jerky or protein bar (5-10 g) L ( PM): 5 oz burger with cheese no bread or pimento cheese and celery or  3 oz steak with salad (7- 21 g) Snk (PM): almonds or beef jerky (5 g) or protein bar 2 x week (13g) D ( PM): 2-3 oz protein with pintos and salad or greens (14-21g) Snk ( PM): unsweetened applesauce or nothing or sugar free chocolate pudding or popcorn  Fluid intake: 30 oz of water and 11oz protein shake, occasional G2 Gatorade (1 x week), 1/2 sweet tea if out to lunch (2 x week) (about 40-50 oz fluid)  Estimated total protein intake: 60+g protein  Medications: see list Supplementation: taking, having trouble remembering to take Calcium  Using straws: sometimes, tries  not to Drinking while eating: sometimes, tries not to Hair loss: has had hair loss, starting to grow back  Carbonated beverages: no N/V/D/C: none Dumping syndrome: none  Recent physical activity:  Treadmill for 30 minutes sporadically and walks at lunch sometimes when the weather is good   Progress Towards Goal(s):  In progress.   Nutritional Diagnosis:  [place]-3.3 Overweight/obesity related to past poor dietary habits and physical inactivity as evidenced by patient w/ recent gastric sleeve surgery following dietary guidelines for continued weight loss.     Intervention:  Nutrition counseling provided.  Goals:  Follow Phase 3B: High Protein + Non-Starchy Vegetables  Eat 3-6 small meals/snacks, every 3-5 hrs  Increase lean protein foods to meet 60g goal  Increase fluid intake to 64oz+ (G2 Gatorade, skim milk, Powerade Zero, lemon or lime juice added to water, hot herbal tea, SF popsicles)  Avoid drinking 15 minutes before, during and 30 minutes after eating  Aim for >30 min of physical activity daily  Plan to walk 3 x week for 40 minutes   Eat protein foods first, then vegetables, and had some carbs (up to 15 g) per meal   Teaching Method Utilized:  Visual Auditory  Barriers to learning/adherence to lifestyle change: food preferences  Demonstrated degree of understanding via:  Teach Back   Monitoring/Evaluation:  Dietary intake, exercise, and body weight. Follow up in 3 months for10 month post-op visit.

## 2015-01-10 NOTE — Patient Instructions (Addendum)
  Goals:  Follow Phase 3B: High Protein + Non-Starchy Vegetables  Eat 3-6 small meals/snacks, every 3-5 hrs  Increase lean protein foods to meet 60g goal  Increase fluid intake to 64oz+ (G2 Gatorade, skim milk, Powerade Zero, lemon or lime juice added to water, hot herbal tea, SF popsicles)  Avoid drinking 15 minutes before, during and 30 minutes after eating  Aim for >30 min of physical activity daily  Plan to walk 3 x week for 40 minutes   Eat protein foods first, then vegetables, and had some carbs (up to 15 g) per meal  Surgery date: 05/24/2014 Surgery type: sleeve Start weight at [facility]: 249 on 03/30/2014 Weight today: 179.0 lbs  Weight change: 9.5 lbs Total weight loss: 69.5 lbs Weight loss goal: 150 lbs  TANITA  BODY COMP RESULTS  05/09/2014 06/07/14 07/19/14 10/11/14 01/10/15   BMI (kg/m^2) 41.4 38.1 35.4 31.4 29.8   Fat Mass (lbs) 113 99.5 88 71.0 63.0   Fat Free Mass (lbs) 136 129.5 125 117.5 116.0   Total Body Water (lbs) 99.5 95.0 91.5 86.0 85.0

## 2015-04-10 ENCOUNTER — Encounter: Payer: Self-pay | Admitting: Dietician

## 2015-04-10 ENCOUNTER — Encounter: Payer: 59 | Attending: General Surgery | Admitting: Dietician

## 2015-04-10 VITALS — Ht 65.0 in | Wt 174.0 lb

## 2015-04-10 DIAGNOSIS — Z6828 Body mass index (BMI) 28.0-28.9, adult: Secondary | ICD-10-CM | POA: Insufficient documentation

## 2015-04-10 DIAGNOSIS — Z713 Dietary counseling and surveillance: Secondary | ICD-10-CM | POA: Insufficient documentation

## 2015-04-10 NOTE — Progress Notes (Signed)
  Follow-up visit:  10 Months Post-Operative gastric sleeve Surgery  Medical Nutrition Therapy:  Appt start time: 530 end time:  550  Primary concerns today: Post-operative Bariatric Surgery Nutrition Management. Laura Mccarthy returns today with a 5 lbs weight loss since last visit 3 months ago. Body fat % is now 33.7%. Has not been exercising as much lately and has had a lot of stuff going on. Just got back from vacation in [place].   Can tolerate most foods.  Surgery date: 05/24/2014 Surgery type: sleeve Start weight at [facility]: 249 on 03/30/2014 Weight today: 174.0 lbs  Weight change: 5 lbs Total weight loss: 74.5 lbs Weight loss goal: 150 lbs  TANITA  BODY COMP RESULTS  05/09/2014 06/07/14 07/19/14 10/11/14 01/10/15 04/10/15   BMI (kg/m^2) 41.4 38.1 35.4 31.4 29.8 29.0   Fat Mass (lbs) 113 99.5 88 71.0 63.0 58.5   Fat Free Mass (lbs) 136 129.5 125 117.5 116.0 115.5   Total Body Water (lbs) 99.5 95.0 91.5 86.0 85.0 84.5   Preferred Learning Style:   No preference indicated   Learning Readiness:   Ready  24-hr recall: B ( AM): premier protein shake (30g) or egg and bacon on weekend Snk (  AM): almonds or beef jerky or protein bar (5-10 g) L ( PM): 5 oz burger with cheese no bread or pimento cheese and celery or  3 oz steak with salad (7- 21 g) Snk (PM): almonds or beef jerky (5 g) or protein bar 2 x week (13g) D ( PM): 2-3 oz protein with pintos and salad or greens (14-21g) Snk ( PM): unsweetened applesauce or nothing or sugar free chocolate pudding or popcorn  Fluid intake: 30 oz of water and 11oz protein shake, 1/2 sweet tea if out to lunch (2 x week) (about 40-50 oz fluid)  Estimated total protein intake: 60+g protein  Medications: see list Supplementation: taking   Using straws: sometimes, tries not to (doesn't think about it) Drinking while eating: sometimes, tries not to Hair loss: No Carbonated beverages: no N/V/D/C: none Dumping syndrome: none  Recent physical activity:   Walking sometimes about 1 x week, walked on vacation  Progress Towards Goal(s):  In progress.   Nutritional Diagnosis:  -3.3 Overweight/obesity related to past poor dietary habits and physical inactivity as evidenced by patient w/ recent gastric sleeve surgery following dietary guidelines for continued weight loss.     Intervention:  Nutrition counseling provided.  Goals:  Follow Phase 3B: High Protein + Non-Starchy Vegetables  Eat 3-6 small meals/snacks, every 3-5 hrs  Increase lean protein foods to meet 60g goal  Increase fluid intake to 64oz+ (skim milk, Powerade Zero, lemon or lime juice added to water, hot herbal tea, SF popsicles)  Avoid drinking 15 minutes before, during and 30 minutes after eating  Aim for >30 min of physical activity daily  Plan to walk 3 x week for 40 minutes and/or weights  Eat protein foods first, then vegetables, and had some carbs (up to 15 g) per meal   Teaching Method Utilized:  Visual Auditory  Barriers to learning/adherence to lifestyle change: none  Demonstrated degree of understanding via:  Teach Back   Monitoring/Evaluation:  Dietary intake, exercise, and body weight. Follow up in 3 months for 13 month post-op visit.

## 2015-04-10 NOTE — Patient Instructions (Addendum)
  Goals:  Follow Phase 3B: High Protein + Non-Starchy Vegetables  Eat 3-6 small meals/snacks, every 3-5 hrs  Increase lean protein foods to meet 60g goal  Increase fluid intake to 64oz+ (skim milk, Powerade Zero, lemon or lime juice added to water, hot herbal tea, SF popsicles)  Avoid drinking 15 minutes before, during and 30 minutes after eating  Aim for >30 min of physical activity daily  Plan to walk 3 x week for 40 minutes and/or weights  Eat protein foods first, then vegetables, and had some carbs (up to 15 g) per meal  Surgery date: 05/24/2014 Surgery type: sleeve Start weight at [facility]: 249 on 03/30/2014 Weight today: 174.0 lbs  Weight change: 5 lbs Total weight loss: 74.5 lbs Weight loss goal: 150 lbs  Body fat is 33.7% - normal range  TANITA  BODY COMP RESULTS  05/09/2014 06/07/14 07/19/14 10/11/14 01/10/15 04/10/15   BMI (kg/m^2) 41.4 38.1 35.4 31.4 29.8 29.0   Fat Mass (lbs) 113 99.5 88 71.0 63.0 58.5   Fat Free Mass (lbs) 136 129.5 125 117.5 116.0 115.5   Total Body Water (lbs) 99.5 95.0 91.5 86.0 85.0 84.5

## 2015-06-08 ENCOUNTER — Telehealth: Payer: Self-pay | Admitting: Family Medicine

## 2015-06-08 DIAGNOSIS — Z0000 Encounter for general adult medical examination without abnormal findings: Secondary | ICD-10-CM

## 2015-06-08 NOTE — Telephone Encounter (Signed)
Pt schedule her cpx on 8/9 and would like to get her labs done at [facility] [address]  Please order

## 2015-06-09 NOTE — Telephone Encounter (Signed)
Labs ordered.

## 2015-06-23 ENCOUNTER — Other Ambulatory Visit: Payer: Self-pay | Admitting: Family Medicine

## 2015-06-23 DIAGNOSIS — Z0000 Encounter for general adult medical examination without abnormal findings: Secondary | ICD-10-CM

## 2015-06-23 LAB — COMPREHENSIVE METABOLIC PANEL
ALK PHOS: 59 U/L (ref 33–130)
ALT: 25 U/L (ref 6–29)
AST: 17 U/L (ref 10–35)
Albumin: 4.2 g/dL (ref 3.6–5.1)
BUN: 17 mg/dL (ref 7–25)
CALCIUM: 9.2 mg/dL (ref 8.6–10.4)
CHLORIDE: 102 mmol/L (ref 98–110)
CO2: 27 mmol/L (ref 20–31)
Creat: 0.78 mg/dL (ref 0.50–0.99)
Glucose, Bld: 78 mg/dL (ref 65–99)
Potassium: 4.5 mmol/L (ref 3.5–5.3)
SODIUM: 140 mmol/L (ref 135–146)
Total Bilirubin: 0.5 mg/dL (ref 0.2–1.2)
Total Protein: 6.6 g/dL (ref 6.1–8.1)

## 2015-06-23 LAB — LIPID PANEL
Cholesterol: 175 mg/dL (ref 125–200)
HDL: 54 mg/dL (ref >=46)
LDL CALC: 106 mg/dL (ref <130)
Total CHOL/HDL Ratio: 3.2 Ratio (ref <=5.0)
Triglycerides: 76 mg/dL (ref <150)
VLDL: 15 mg/dL (ref <30)

## 2015-06-23 LAB — CBC WITH DIFFERENTIAL/PLATELET
BASOS ABS: 0.1 10*3/uL (ref 0.0–0.1)
Basophils Relative: 1 % (ref 0–1)
Eosinophils Absolute: 0.1 10*3/uL (ref 0.0–0.7)
Eosinophils Relative: 2 % (ref 0–5)
HEMATOCRIT: 40.8 % (ref 36.0–46.0)
Hemoglobin: 13.5 g/dL (ref 12.0–15.0)
Lymphocytes Relative: 27 % (ref 12–46)
Lymphs Abs: 1.9 10*3/uL (ref 0.7–4.0)
MCH: 30.2 pg (ref 26.0–34.0)
MCHC: 33.1 g/dL (ref 30.0–36.0)
MCV: 91.3 fL (ref 78.0–100.0)
MPV: 9.4 fL (ref 8.6–12.4)
Monocytes Absolute: 0.6 10*3/uL (ref 0.1–1.0)
Monocytes Relative: 9 % (ref 3–12)
NEUTROS PCT: 61 % (ref 43–77)
Neutro Abs: 4.3 10*3/uL (ref 1.7–7.7)
Platelets: 299 10*3/uL (ref 150–400)
RBC: 4.47 MIL/uL (ref 3.87–5.11)
RDW: 13.6 % (ref 11.5–15.5)
WBC: 7 10*3/uL (ref 4.0–10.5)

## 2015-06-23 LAB — TSH: TSH: 2.239 u[IU]/mL (ref 0.350–4.500)

## 2015-07-04 ENCOUNTER — Encounter: Payer: Self-pay | Admitting: Family Medicine

## 2015-07-04 ENCOUNTER — Ambulatory Visit (INDEPENDENT_AMBULATORY_CARE_PROVIDER_SITE_OTHER): Payer: 59 | Admitting: Family Medicine

## 2015-07-04 ENCOUNTER — Ambulatory Visit (INDEPENDENT_AMBULATORY_CARE_PROVIDER_SITE_OTHER)
Admission: RE | Admit: 2015-07-04 | Discharge: 2015-07-04 | Disposition: A | Discharge status: Home / Self Care | Payer: 59 | Source: Ambulatory Visit | Attending: Family Medicine | Admitting: Family Medicine

## 2015-07-04 VITALS — BP 120/72 | HR 87 | Temp 98.6°F | Wt 181.0 lb

## 2015-07-04 DIAGNOSIS — F329 Major depressive disorder, single episode, unspecified: Secondary | ICD-10-CM

## 2015-07-04 DIAGNOSIS — Z0000 Encounter for general adult medical examination without abnormal findings: Secondary | ICD-10-CM

## 2015-07-04 DIAGNOSIS — J4520 Mild intermittent asthma, uncomplicated: Secondary | ICD-10-CM

## 2015-07-04 DIAGNOSIS — F32A Depression, unspecified: Secondary | ICD-10-CM

## 2015-07-04 DIAGNOSIS — K219 Gastro-esophageal reflux disease without esophagitis: Secondary | ICD-10-CM

## 2015-07-04 DIAGNOSIS — R0781 Pleurodynia: Secondary | ICD-10-CM | POA: Diagnosis not present

## 2015-07-04 DIAGNOSIS — Z1211 Encounter for screening for malignant neoplasm of colon: Secondary | ICD-10-CM

## 2015-07-04 DIAGNOSIS — Z9884 Bariatric surgery status: Secondary | ICD-10-CM

## 2015-07-04 NOTE — Assessment & Plan Note (Signed)
Reviewed preventive care protocols, scheduled due services, and updated immunizations Discussed nutrition, exercise, diet, and healthy lifestyle.  Orders Placed This Encounter  Procedures  . Ambulatory referral to Gastroenterology    

## 2015-07-04 NOTE — Assessment & Plan Note (Signed)
New- ? Rib contusion vs fracture. Xray today. Lung exam reassuring.

## 2015-07-04 NOTE — Assessment & Plan Note (Signed)
Well controlled on current rxs. Followed by psychiatry. No changes made today.

## 2015-07-04 NOTE — Assessment & Plan Note (Signed)
Doing well.  Feels much better and not having any GI issues s/p bariatric surgery.

## 2015-07-04 NOTE — Progress Notes (Signed)
Subjective:    Patient ID: Laura Mccarthy, female    DOB: Feb 01, 1956, 59 y.o.   MRN: [id number]  HPI  59 yo here for CPX and follow up of chronic medical conditions.  I have not seen her in over 3 years.  Since I last saw her, she has had gastric sleeve bariatric surgery on 05/24/14.  Has lost 80 pounds total.  Feels fantastic!  Wt Readings from Last 3 Encounters:  07/04/15 181 lb (82.101 kg)  04/10/15 174 lb (78.926 kg)  01/10/15 179 lb (81.194 kg)   Has had some right upper rib pain.  First felt it when she was giving her dog a bath.  Not severe.  Remote h/o cholecystectomy.  Asthma-  Has rescue inhaler, does not use it very frequently. Mild seasonal allergies, does not take anything for it.  Depression-  Now taking Cymbalta and Wellbutrin, no longer taking celexa.  Sees Dr. Toy Care every 6 months. Denies any symptoms of anxiety or depression.  Well woman- sees PA at [facility]. Mammogram 07/11/14.  Scheduling her mammogram this week. G0, no h/o abnormal pap smears. Has never had a colonoscopy. Denis any blood in her stool or changes in her bowel.   Lab Results  Component Value Date   CHOL 175 06/23/2015   HDL 54 06/23/2015   LDLCALC 106 06/23/2015   TRIG 76 06/23/2015   CHOLHDL 3.2 06/23/2015   Lab Results  Component Value Date   CREATININE 0.78 06/23/2015   Lab Results  Component Value Date   NA 140 06/23/2015   K 4.5 06/23/2015   CL 102 06/23/2015   CO2 27 06/23/2015   Lab Results  Component Value Date   ALT 25 06/23/2015   AST 17 06/23/2015   ALKPHOS 59 06/23/2015   BILITOT 0.5 06/23/2015   Lab Results  Component Value Date   WBC 7.0 06/23/2015   HGB 13.5 06/23/2015   HCT 40.8 06/23/2015   MCV 91.3 06/23/2015   PLT 299 06/23/2015   Lab Results  Component Value Date   TSH 2.239 06/23/2015    Patient Active Problem List   Diagnosis Date Noted  . Depression 05/26/2014  . GERD (gastroesophageal reflux disease) 05/26/2014  . 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 Obesity, Class III, BMI 40-49.9 (morbid obesity) 02/18/2014  . Asthma 03/18/2012  . Routine general medical examination at a health care facility 03/18/2012   Past Medical History  Diagnosis Date  . GERD (gastroesophageal reflux disease)   . Depression   . Complication of anesthesia   . PONV (postoperative nausea and vomiting)   . Asthma     controlled-no meds  . ADD (attention deficit disorder)     takes Adderall daily  . Headache(784.0)     migraines every 3 months or so- no special meds   Past Surgical History  Procedure Laterality Date  . Cholecystectomy    . Neuroplasty / transposition median nerve at carpal tunnel bilateral    . Laparoscopic gastric sleeve resection N/A 05/24/2014    Procedure: LAPAROSCOPIC GASTRIC SLEEVE RESECTION UPPER ENDO;  Surgeon: Gayland Curry, MD;  Location: [facility] ORS;  Service: General;  Laterality: N/A;  . Upper gi endoscopy  05/24/2014    Procedure: UPPER GI ENDOSCOPY;  Surgeon: Gayland Curry, MD;  Location: [facility];  Service: General;;   History  Substance Use Topics  . Smoking status: Never Smoker   . Smokeless tobacco: Not on file  . Alcohol Use: Yes  Comment: occ. to rare social   Family History  Problem Relation Age of Onset  . Heart disease Mother   . Heart disease Father   . Diabetes Sister    No Known Allergies Current Outpatient Prescriptions on File Prior to Visit  Medication Sig Dispense Refill  . acetaminophen (TYLENOL) 500 MG tablet Take 1,000 mg by mouth every 6 (six) hours as needed for mild pain or moderate pain.    Marland Kitchen amphetamine-dextroamphetamine (ADDERALL) 15 MG tablet Take 30 mg by mouth every morning.     Marland Kitchen buPROPion (WELLBUTRIN) 75 MG tablet Take 2 tablets (150 mg total) by mouth 3 (three) times daily. 90 tablet 0  . DULoxetine (CYMBALTA) 60 MG capsule Take 60 mg by mouth every morning.     No current facility-administered medications on file prior to visit.   The PMH, PSH, Social  History, Family History, Medications, and allergies have been reviewed in [facility], and have been updated if relevant.    Review of Systems  Constitutional: Negative.   HENT: Negative.   Eyes: Negative.   Respiratory: Negative.   Cardiovascular: Positive for chest pain. Negative for palpitations and leg swelling.  Gastrointestinal: Negative.   Endocrine: Negative.   Genitourinary: Negative.   Musculoskeletal: Negative.   Skin: Negative.   Allergic/Immunologic: Negative.   Neurological: Negative.   Hematological: Negative.   Psychiatric/Behavioral: Negative.   All other systems reviewed and are negative.      Objective:   Physical Exam BP 120/72 mmHg  Pulse 87  Temp(Src) 98.6 F (37 C) (Oral)  Wt 181 lb (82.101 kg)  SpO2 96%   General:  Well-developed,well-nourished,in no acute distress; alert,appropriate and cooperative throughout examination Head:  normocephalic and atraumatic.   Eyes:  vision grossly intact, pupils equal, pupils round, and pupils reactive to light.   Ears:  R ear normal and L ear normal.   Nose:  no external deformity.   Mouth:  good dentition.   Lungs:  Normal respiratory effort, chest expands symmetrically. Lungs are clear to auscultation, no crackles or wheezes. Chest wall:  TTP over inferior right rib Heart:  Normal rate and regular rhythm. S1 and S2 normal without gallop, murmur, click, rub or other extra sounds. Msk:  No deformity or scoliosis noted of thoracic or lumbar spine.   Extremities:  No clubbing, cyanosis, edema, or deformity noted with normal full range of motion of all joints.   Neurologic:  alert & oriented X3 and gait normal.   Skin:  Intact without suspicious lesions or rashes Psych:  Cognition and judgment appear intact. Alert and cooperative with normal attention span and concentration. No apparent delusions, illusions, hallucinations       Assessment & Plan:

## 2015-07-04 NOTE — Patient Instructions (Addendum)
Great to see you.  Check with your insurance to see if they will cover the shingles shot.  Please get your xray done and than stop by to see marion or Vaughan Basta on your way out to set up your colonoscopy appointment.

## 2015-07-11 ENCOUNTER — Ambulatory Visit: Payer: Commercial Managed Care - PPO | Admitting: Dietician

## 2015-07-25 ENCOUNTER — Encounter: Payer: 59 | Attending: General Surgery | Admitting: Dietician

## 2015-07-25 ENCOUNTER — Encounter: Payer: Self-pay | Admitting: Dietician

## 2015-07-25 VITALS — Ht 65.0 in | Wt 179.5 lb

## 2015-07-25 DIAGNOSIS — Z713 Dietary counseling and surveillance: Secondary | ICD-10-CM | POA: Insufficient documentation

## 2015-07-25 DIAGNOSIS — Z6829 Body mass index (BMI) 29.0-29.9, adult: Secondary | ICD-10-CM | POA: Diagnosis not present

## 2015-07-25 NOTE — Patient Instructions (Addendum)
  Goals:  Follow Phase 3B: High Protein + Non-Starchy Vegetables  Eat 3-6 small meals/snacks, every 3-5 hrs  Increase lean protein foods to meet 60g goal  Increase fluid intake to 64oz+ (skim milk, Powerade Zero, lemon or lime juice added to water, hot herbal tea, SF popsicles)  Avoid drinking 15 minutes before, during and 30 minutes after eating  Aim for >30 min of physical activity daily  Plan to walk 3 x week for 40 minutes and/or do weights  Eat protein foods first, then vegetables, and had some carbs (up to 15 g) per meal  Only have snacks if you are feeling hungry  Surgery date: 05/24/2014 Surgery type: sleeve Start weight at [facility]: 249 on 03/30/2014 Weight today: 179.5 lbs  Weight change: 5.5 lbs gain Total weight loss: 74.5 lbs Weight loss goal: 150 lbs  TANITA  BODY COMP RESULTS  05/09/2014 06/07/14 07/19/14 10/11/14 01/10/15 04/10/15 07/25/15   BMI (kg/m^2) 41.4 38.1 35.4 31.4 29.8 29.0 29.9   Fat Mass (lbs) 113 99.5 88 71.0 63.0 58.5 62.5   Fat Free Mass (lbs) 136 129.5 125 117.5 116.0 115.5 117.0   Total Body Water (lbs) 99.5 95.0 91.5 86.0 85.0 84.5 85.5

## 2015-07-25 NOTE — Progress Notes (Signed)
  Follow-up visit:  14 Months Post-Operative gastric sleeve Surgery  Medical Nutrition Therapy:  Appt start time: 1200  end time:  1225  Primary concerns today: Post-operative Bariatric Surgery Nutrition Management. Laura Mccarthy returns today with a 5.5 lbs weight gain since last visit. Has added some carbs (small amounts) and beer occasionally. Not exercising regularly.  Body fat % is now 34.8%.   Surgery date: 05/24/2014 Surgery type: sleeve Start weight at [facility]: 249 on 03/30/2014 Weight today: 179.5 lbs  Weight change: 5.5 lbs gain Total weight loss: 74.5 lbs Weight loss goal: 150 lbs  TANITA  BODY COMP RESULTS  05/09/2014 06/07/14 07/19/14 10/11/14 01/10/15 04/10/15 07/25/15   BMI (kg/m^2) 41.4 38.1 35.4 31.4 29.8 29.0 29.9   Fat Mass (lbs) 113 99.5 88 71.0 63.0 58.5 62.5   Fat Free Mass (lbs) 136 129.5 125 117.5 116.0 115.5 117.0   Total Body Water (lbs) 99.5 95.0 91.5 86.0 85.0 84.5 85.5   Preferred Learning Style:   No preference indicated   Learning Readiness:   Ready  24-hr recall: B ( AM): premier protein shake (30g) or egg and bacon with 1/2 piece of toast on weekend Snk (  AM): s[place]ento protein pack (5-10 g) L ( PM): 5 oz burger with cheese no bread or pimento cheese and celery or  3 oz steak with salad (7- 21 g) Snk (PM): almonds or beef jerky (5 g) or protein bar 2 x week (13g) D ( PM): 2-3 oz protein with pintos and salad or greens (14-21g) Snk ( PM): unsweetened applesauce or nothing or sugar free chocolate pudding or popcorn  Fluid intake: 30 oz of water and 11oz protein shake, 1/2 sweet tea if out to lunch (2 x week) (about 40-50 oz fluid)  Estimated total protein intake: 60+g protein  Medications: see list Supplementation: taking   Using straws: sometimes, tries not to (doesn't think about it) Drinking while eating: sometimes, tries not to Hair loss: No Carbonated beverages: no N/V/D/C: none Dumping syndrome: none  Recent physical activity:  Parking far away,  taking the stairs (more active than before)  Progress Towards Goal(s):  In progress.   Nutritional Diagnosis:  [place]-3.3 Overweight/obesity related to past poor dietary habits and physical inactivity as evidenced by patient w/ recent gastric sleeve surgery following dietary guidelines for continued weight loss.     Intervention:  Nutrition counseling provided.  Goals:  Follow Phase 3B: High Protein + Non-Starchy Vegetables  Eat 3-6 small meals/snacks, every 3-5 hrs  Increase lean protein foods to meet 60g goal  Increase fluid intake to 64oz+ (skim milk, Powerade Zero, lemon or lime juice added to water, hot herbal tea, SF popsicles)  Avoid drinking 15 minutes before, during and 30 minutes after eating  Aim for >30 min of physical activity daily  Plan to walk 3 x week for 40 minutes and/or do weights  Eat protein foods first, then vegetables, and had some carbs (up to 15 g) per meal  Only have snacks if you are feeling hungry   Teaching Method Utilized:  Visual Auditory  Barriers to learning/adherence to lifestyle change: none  Demonstrated degree of understanding via:  Teach Back   Monitoring/Evaluation:  Dietary intake, exercise, and body weight. Follow up in 6 months for 20 month post-op visit.

## 2015-08-31 ENCOUNTER — Other Ambulatory Visit: Payer: Self-pay

## 2015-08-31 DIAGNOSIS — Z1231 Encounter for screening mammogram for malignant neoplasm of breast: Secondary | ICD-10-CM

## 2015-09-07 ENCOUNTER — Encounter: Payer: Self-pay | Admitting: Internal Medicine

## 2015-09-12 ENCOUNTER — Ambulatory Visit
Admission: RE | Admit: 2015-09-12 | Discharge: 2015-09-12 | Disposition: A | Discharge status: Home / Self Care | Payer: 59 | Source: Ambulatory Visit

## 2015-09-12 DIAGNOSIS — Z1231 Encounter for screening mammogram for malignant neoplasm of breast: Secondary | ICD-10-CM

## 2015-10-04 ENCOUNTER — Encounter: Payer: Self-pay | Admitting: Internal Medicine

## 2015-11-03 ENCOUNTER — Encounter: Payer: 59 | Admitting: Internal Medicine

## 2015-11-07 ENCOUNTER — Encounter: Payer: 59 | Admitting: Internal Medicine

## 2015-11-26 HISTORY — PX: POLYPECTOMY: SHX149

## 2015-11-26 HISTORY — PX: COLONOSCOPY: SHX174

## 2015-11-28 ENCOUNTER — Ambulatory Visit (AMBULATORY_SURGERY_CENTER): Payer: Self-pay | Admitting: *Deleted

## 2015-11-28 VITALS — Ht 64.0 in | Wt 183.8 lb

## 2015-11-28 DIAGNOSIS — Z1211 Encounter for screening for malignant neoplasm of colon: Secondary | ICD-10-CM

## 2015-11-28 MED ORDER — NA SULFATE-K SULFATE-MG SULF 17.5-3.13-1.6 GM/177ML PO SOLN: 1.0000 | Freq: Once | ORAL | Status: DC

## 2015-11-28 NOTE — Progress Notes (Signed)
No egg or soy allergy known to patient  No issues with past sedation with any surgeries  or procedures, no intubation problems --post N/V hx of -pt has gastric sleeve procedure 1 1/2 (04-2014) and was told to notify with surgery to not intubate or to be very careful with intubation due to sleeve  No diet pills per patient  No home 02 use per patient   emmi video declined'  Medication Samples have been provided to the patient.  Drug name: suprep  Qty: 1  [id number]  Exp.Date: 11-18  The patient has been instructed regarding the correct time, dose, and frequency of taking this medication, including desired effects and most common side effects.   Laura Mccarthy 2:16 PM 11/28/2015

## 2015-11-30 DIAGNOSIS — Z9884 Bariatric surgery status: Secondary | ICD-10-CM | POA: Diagnosis not present

## 2015-11-30 DIAGNOSIS — E663 Overweight: Secondary | ICD-10-CM | POA: Diagnosis not present

## 2015-11-30 DIAGNOSIS — Z8719 Personal history of other diseases of the digestive system: Secondary | ICD-10-CM | POA: Diagnosis not present

## 2015-11-30 DIAGNOSIS — Z8639 Personal history of other endocrine, nutritional and metabolic disease: Secondary | ICD-10-CM | POA: Diagnosis not present

## 2015-12-18 ENCOUNTER — Telehealth: Payer: Self-pay | Admitting: Internal Medicine

## 2015-12-18 NOTE — Telephone Encounter (Signed)
Discussed with pt that as long as she has no fever and she feels well she is fine to proceed with colon tomorrow.

## 2015-12-19 ENCOUNTER — Encounter: Payer: Self-pay | Admitting: Internal Medicine

## 2015-12-19 ENCOUNTER — Ambulatory Visit (AMBULATORY_SURGERY_CENTER): Payer: 59 | Admitting: Internal Medicine

## 2015-12-19 VITALS — BP 122/76 | HR 64 | Temp 98.3°F | Resp 18 | Ht 64.0 in | Wt 183.0 lb

## 2015-12-19 DIAGNOSIS — K635 Polyp of colon: Secondary | ICD-10-CM

## 2015-12-19 DIAGNOSIS — D123 Benign neoplasm of transverse colon: Secondary | ICD-10-CM

## 2015-12-19 DIAGNOSIS — J45909 Unspecified asthma, uncomplicated: Secondary | ICD-10-CM | POA: Diagnosis not present

## 2015-12-19 DIAGNOSIS — D122 Benign neoplasm of ascending colon: Secondary | ICD-10-CM

## 2015-12-19 DIAGNOSIS — Z1211 Encounter for screening for malignant neoplasm of colon: Secondary | ICD-10-CM

## 2015-12-19 MED ORDER — SODIUM CHLORIDE 0.9 % IV SOLN: 500.0000 mL | INTRAVENOUS | Status: DC

## 2015-12-19 NOTE — Progress Notes (Signed)
Patient awakening,vss,report to rn 

## 2015-12-19 NOTE — Patient Instructions (Signed)
YOU HAD AN ENDOSCOPIC PROCEDURE TODAY AT [facility]:   Refer to the procedure report that was given to you for any specific questions about what was found during the examination.  If the procedure report does not answer your questions, please call your gastroenterologist to clarify.  If you requested that your care partner not be given the details of your procedure findings, then the procedure report has been included in a sealed envelope for you to review at your convenience later.  YOU SHOULD EXPECT: Some feelings of bloating in the abdomen. Passage of more gas than usual.  Walking can help get rid of the air that was put into your GI tract during the procedure and reduce the bloating. If you had a lower endoscopy (such as a colonoscopy or flexible sigmoidoscopy) you may notice spotting of blood in your stool or on the toilet paper. If you underwent a bowel prep for your procedure, you may not have a normal bowel movement for a few days.  Please Note:  You might notice some irritation and congestion in your nose or some drainage.  This is from the oxygen used during your procedure.  There is no need for concern and it should clear up in a day or so.  SYMPTOMS TO REPORT IMMEDIATELY:   Following lower endoscopy (colonoscopy or flexible sigmoidoscopy):  Excessive amounts of blood in the stool  Significant tenderness or worsening of abdominal pains  Swelling of the abdomen that is new, acute  Fever of 100F or higher   For urgent or emergent issues, a gastroenterologist can be reached at any hour by calling [phone].   DIET: Your first meal following the procedure should be a small meal and then it is ok to progress to your normal diet. Heavy or fried foods are harder to digest and may make you feel nauseous or bloated.  Likewise, meals heavy in dairy and vegetables can increase bloating.  Drink plenty of fluids but you should avoid alcoholic beverages for 24 hours.  Try to  increase the fiber in your diet.  ACTIVITY:  You should plan to take it easy for the rest of today and you should NOT DRIVE or use heavy machinery until tomorrow (because of the sedation medicines used during the test).    FOLLOW UP: Our staff will call the number listed on your records the next business day following your procedure to check on you and address any questions or concerns that you may have regarding the information given to you following your procedure. If we do not reach you, we will leave a message.  However, if you are feeling well and you are not experiencing any problems, there is no need to return our call.  We will assume that you have returned to your regular daily activities without incident.  If any biopsies were taken you will be contacted by phone or by letter within the next 1-3 weeks.  Please call us at [phone] if you have not heard about the biopsies in 3 weeks.   Read all of the handouts given to you by your recovery room nurse. Thank-you for choosing [place] for your healthcare needs today.   SIGNATURES/CONFIDENTIALITY: You and/or your care partner have signed paperwork which will be entered into your electronic medical record.  These signatures attest to the fact that that the information above on your After Visit Summary has been reviewed and is understood.  Full responsibility of the confidentiality of this discharge  information lies with you and/or your care-partner. 

## 2015-12-19 NOTE — Op Note (Signed)
[place]  [address] [place], [id number]   COLONOSCOPY PROCEDURE REPORT  PATIENT: Laura Mccarthy, Laura Mccarthy  MR#: [id number] BIRTHDATE: June 08, 1956 , 59  yrs. old GENDER: female ENDOSCOPIST: Eustace Quail, MD REFERRED [id number] Aron, M.D. PROCEDURE DATE:  12/19/2015 PROCEDURE:   Colonoscopy, screening and Colonoscopy with snare polypectomy x 4 First Screening Colonoscopy - Avg.  risk and is 50 yrs.  old or older Yes.  Prior Negative Screening - Now for repeat screening. N/A  History of Adenoma - Now for follow-up colonoscopy & has been > or = to 3 yrs.  N/A  Polyps removed today? Yes ASA CLASS:   Class II INDICATIONS:Screening for colonic neoplasia and Colorectal Neoplasm Risk Assessment for this procedure is average risk. MEDICATIONS: Monitored anesthesia care and Propofol 400 mg IV  DESCRIPTION OF PROCEDURE:   After the risks benefits and alternatives of the procedure were thoroughly explained, informed consent was obtained.  The digital rectal exam revealed no abnormalities of the rectum.   The [id number]  endoscope was introduced through the anus and advanced to the cecum, which was identified by both the appendix and ileocecal valve. No adverse events experienced.   The quality of the prep was good.  (Suprep was used)  The instrument was then slowly withdrawn as the colon was fully examined. Estimated blood loss is zero unless otherwise noted in this procedure report.  COLON FINDINGS: Four polyps were found in the transverse colon (15 mm pedunculated, 4 mm, and 6 mm)and ascending colon(4 mm).  A polypectomy was performed with a cold snare for the small or polyps and snare cautery for the large transverse colon polyp.  The resection was complete, the polyp tissue was completely retrieved and sent to histology.   The examination was otherwise normal. Retroflexed views revealed internal hemorrhoids. The time to cecum = 3.5 Withdrawal time = 18.3   The  scope was withdrawn and the procedure completed. COMPLICATIONS: There were no immediate complications.  ENDOSCOPIC IMPRESSION: 1.   Four polyps were found in the colon; polypectomy was performed with a cold snare and hot snare techniques 2.   The examination was otherwise normal  RECOMMENDATIONS: 1. Repeat Colonoscopy in 3 years.  eSigned:  Eustace Quail, MD 12/19/2015 10:37 AM   cc: The Patient and Arnette Norris MD

## 2015-12-20 ENCOUNTER — Telehealth: Payer: Self-pay

## 2015-12-20 ENCOUNTER — Telehealth: Payer: 59 | Admitting: Family

## 2015-12-20 DIAGNOSIS — J069 Acute upper respiratory infection, unspecified: Secondary | ICD-10-CM | POA: Diagnosis not present

## 2015-12-20 MED ORDER — BENZONATATE 100 MG PO CAPS: 100.0000 mg | ORAL_CAPSULE | Freq: Two times a day (BID) | ORAL | Status: DC | PRN

## 2015-12-20 NOTE — Progress Notes (Signed)

## 2015-12-20 NOTE — Telephone Encounter (Signed)
  Follow up Call-  Call back number 12/19/2015  Post procedure Call Back phone  # [phone]  Permission to leave phone message Yes     Patient was called for follow up after procedure on 12/19/2015. No answer at the number given for follow up phone call. A message was left on the answering machine.

## 2015-12-26 ENCOUNTER — Encounter: Payer: Self-pay | Admitting: Internal Medicine

## 2016-01-04 DIAGNOSIS — K142 Median rhomboid glossitis: Secondary | ICD-10-CM | POA: Insufficient documentation

## 2016-01-08 MED FILL — DULoxetine HCL 60 MG CPEP: 60 | 90 days supply | Qty: 90 | Fill #2

## 2016-01-08 MED FILL — BUPROPION HCL XL 150 MG TAB: 150 | 90 days supply | Qty: 270 | Fill #3

## 2016-01-23 ENCOUNTER — Encounter: Payer: 59 | Attending: Family Medicine | Admitting: Dietician

## 2016-01-23 DIAGNOSIS — Z9884 Bariatric surgery status: Secondary | ICD-10-CM | POA: Insufficient documentation

## 2016-01-23 NOTE — Progress Notes (Signed)
Follow-up visit:  20 Months Post-Operative gastric sleeve Surgery  Medical Nutrition Therapy:  Appt start time: 325  end time:  400  Primary concerns today: Post-operative Bariatric Surgery Nutrition Management. Trina returns today with a 5 lbs weight gain since last visit. She reports that 172 lbs was her lowest weight at home. Just starting to use her fitbit and finds that this encourages her to move more. Kalila states she feels like her "sugar addiction" is coming back. Struggling with tempting snacks that are available at work. Feels like she is the type where a small amount of a treat will make her want more.   Samples provided and patient instructed on proper use:  Bariatric Advantage Calcium Citrate chewy bite (chocolate - qty 2) Lot #: [id number] Exp: 09/2016  Bariatric Advantage Calcium Citrate chewy bite (tropical orange - qty 2) Lot #: [id number] Exp: 06/2016  Bariatric Advantage Calcium Citrate chewy bite (peanut butter chocolate - qty 2) Lot #: [id number] Exp: 10/2016  Bariatric Advantage Calcium Citrate chewy bite (coconut - qty 2) Lot #: [id number] Exp: 05/2016  Bariatric Advantage Calcium Citrate chewy bite (caramel - qty 2) Lot #: [id number] Exp: 09/2016  Bariatric Advantage Calcium Citrate chewy bite (strawberry - qty 2) Lot #: [id number] Exp: 10/2016   Surgery date: 05/24/2014 Surgery type: sleeve Start weight at [facility]: 249 on 03/30/2014 Weight today: 184.5 lbs Weight change: 5 lbs gain Total weight loss: 64.5 lbs  TANITA  BODY COMP RESULTS  05/09/2014 06/07/14 07/19/14 10/11/14 01/10/15 04/10/15 07/25/15 01/23/16   BMI (kg/m^2) 41.4 38.1 35.4 31.4 29.8 29.0 29.9 30.7   Fat Mass (lbs) 113 99.5 88 71.0 63.0 58.5 62.5 67.5   Fat Free Mass (lbs) 136 129.5 125 117.5 116.0 115.5 117.0 117   Total Body Water (lbs) 99.5 95.0 91.5 86.0 85.0 84.5 85.5 85.5   Preferred Learning Style:   No preference indicated   Learning Readiness:   Ready  24-hr recall: B ( AM): premier protein  shake OR eggs and sausage on the weekends (30g)  Snk (  AM): sargento protein pack (5-10 g) L ( PM): Pimento cheese and celery (5-10g) Snk (PM):  D ( PM): 2-3 oz protein with pintos and salad or greens (14-21g) Snk ( PM): unsweetened applesauce or nothing or sugar free chocolate pudding or popcorn  Fluid intake: 30 oz of water and 11oz protein shake, 1/2 sweet tea if out to lunch (2 x week) (about 40-50 oz fluid)  Estimated total protein intake: 60+g protein  Medications: see list Supplementation: taking, Calcium inconsistent   Using straws: sometimes, tries not to (doesn't think about it) Drinking while eating: sometimes, tries not to Hair loss: No Carbonated beverages: no N/V/D/C: none Dumping syndrome: none  Recent physical activity:  Inconsistent, treadmill; always takes stairs at work  Progress Towards Goal(s):  In progress.   Nutritional Diagnosis:  [place]-3.3 Overweight/obesity related to past poor dietary habits and physical inactivity as evidenced by patient w/ recent gastric sleeve surgery following dietary guidelines for continued weight loss.     Intervention:  Nutrition counseling provided.  Goals:  Follow Phase 3B: High Protein + Non-Starchy Vegetables  Eat 3-6 small meals/snacks, every 3-5 hrs  Increase lean protein foods to meet 60g goal  Increase fluid intake to 64oz+ (skim milk, Powerade Zero, lemon or lime juice added to water, hot herbal tea, SF popsicles)  Avoid drinking 15 minutes before, during and 30 minutes after eating  Aim for >30 min of physical activity daily  Resume  exercise routine  Walking at work with a buddy  Make dates with gym buddy - put on workout clothes at work  Remember that exercise makes you feel proud  Eat protein foods first, then vegetables, and had some carbs (up to 15 g) per meal  Only have snacks if you are feeling hungry  For something sweet try Bariatric Advantage Calcium Citrate chewy bite (3x a day, 2 hours  apart)  Try PB2 cups  Get back into practicing mindful eating  Pay attention to each bite and chew thoroughly  Avoid distractions  When you allow yourself a treat, try not to feel guilty about it  Pre plan and pre prepare   Teaching Method Utilized:  Visual Auditory  Barriers to learning/adherence to lifestyle change: none  Demonstrated degree of understanding via:  Teach Back   Monitoring/Evaluation:  Dietary intake, exercise, and body weight. Follow up in 6 months for 26 month post-op visit.

## 2016-01-23 NOTE — Patient Instructions (Addendum)
  Goals:  Follow Phase 3B: High Protein + Non-Starchy Vegetables  Eat 3-6 small meals/snacks, every 3-5 hrs  Increase lean protein foods to meet 60g goal  Increase fluid intake to 64oz+ (skim milk, Powerade Zero, lemon or lime juice added to water, hot herbal tea, SF popsicles)  Avoid drinking 15 minutes before, during and 30 minutes after eating  Aim for >30 min of physical activity daily  Resume exercise routine  Walking at work with a buddy  Make dates with gym buddy - put on workout clothes at work  Remember that exercise makes you feel proud  Eat protein foods first, then vegetables, and had some carbs (up to 15 g) per meal  Only have snacks if you are feeling hungry  For something sweet try Bariatric Advantage Calcium Citrate chewy bite  Try PB2 cups  Get back into practicing mindful eating  Pay attention to each bite and chew thoroughly  Avoid distractions  When you allow yourself a treat, try not to feel guilty about it  Pre plan and pre prepare    Nonscale victories: playing on the ground with grandkids, cute clothes, more energy, good lab work, able to cross legs  *Remember that you have not failed and your body fat is in the normal range!

## 2016-01-24 ENCOUNTER — Encounter: Payer: Self-pay | Admitting: Dietician

## 2016-02-27 MED FILL — D-AMPHETAMINE ER 15 MG CAP: 15 | 90 days supply | Qty: 360 | Fill #0

## 2016-03-06 DIAGNOSIS — H40023 Open angle with borderline findings, high risk, bilateral: Secondary | ICD-10-CM | POA: Diagnosis not present

## 2016-03-06 DIAGNOSIS — H524 Presbyopia: Secondary | ICD-10-CM | POA: Diagnosis not present

## 2016-03-06 DIAGNOSIS — H40053 Ocular hypertension, bilateral: Secondary | ICD-10-CM | POA: Diagnosis not present

## 2016-03-06 MED FILL — RESTASIS MULTIDOSE 0.05% EY: 0.05 | 55 days supply | Qty: 6 | Fill #0

## 2016-03-29 ENCOUNTER — Telehealth: Payer: 59 | Admitting: Nurse Practitioner

## 2016-03-29 DIAGNOSIS — R05 Cough: Secondary | ICD-10-CM

## 2016-03-29 DIAGNOSIS — R059 Cough, unspecified: Secondary | ICD-10-CM

## 2016-03-29 MED ORDER — AZITHROMYCIN 250 MG PO TABS: ORAL_TABLET | ORAL | Status: DC

## 2016-03-29 MED ORDER — BENZONATATE 100 MG PO CAPS: 100.0000 mg | ORAL_CAPSULE | Freq: Three times a day (TID) | ORAL | Status: DC | PRN

## 2016-03-29 MED FILL — BENZONATATE 100 MG CAPSULE: 100 | 6 days supply | Qty: 20 | Fill #0

## 2016-03-29 MED FILL — AZITHROMYCIN 250 MG TABLET: 250 | 5 days supply | Qty: 6 | Fill #0

## 2016-03-29 NOTE — Progress Notes (Signed)

## 2016-04-23 MED FILL — DULoxetine HCL 60 MG CPEP: 60 | 90 days supply | Qty: 90 | Fill #0

## 2016-04-23 MED FILL — BUPROPION HCL XL 150 MG TAB: 150 | 90 days supply | Qty: 270 | Fill #0

## 2016-05-29 MED FILL — RESTASIS MULTIDOSE 0.05% EY: 0.05 | 55 days supply | Qty: 6 | Fill #1

## 2016-05-30 MED FILL — D-AMPHETAMINE ER 15 MG CAP: 15 | 90 days supply | Qty: 360 | Fill #0

## 2016-06-22 IMAGING — CR DG RIBS 2V*R*
5 series · 5 of 5 positions shown · non-contrast
Comparison: 02/28/2014

CLINICAL DATA: Right-sided rib pain.  No recent injury

EXAM:
RIGHT RIBS - 2 VIEW

[view not recorded (1 of 5)]
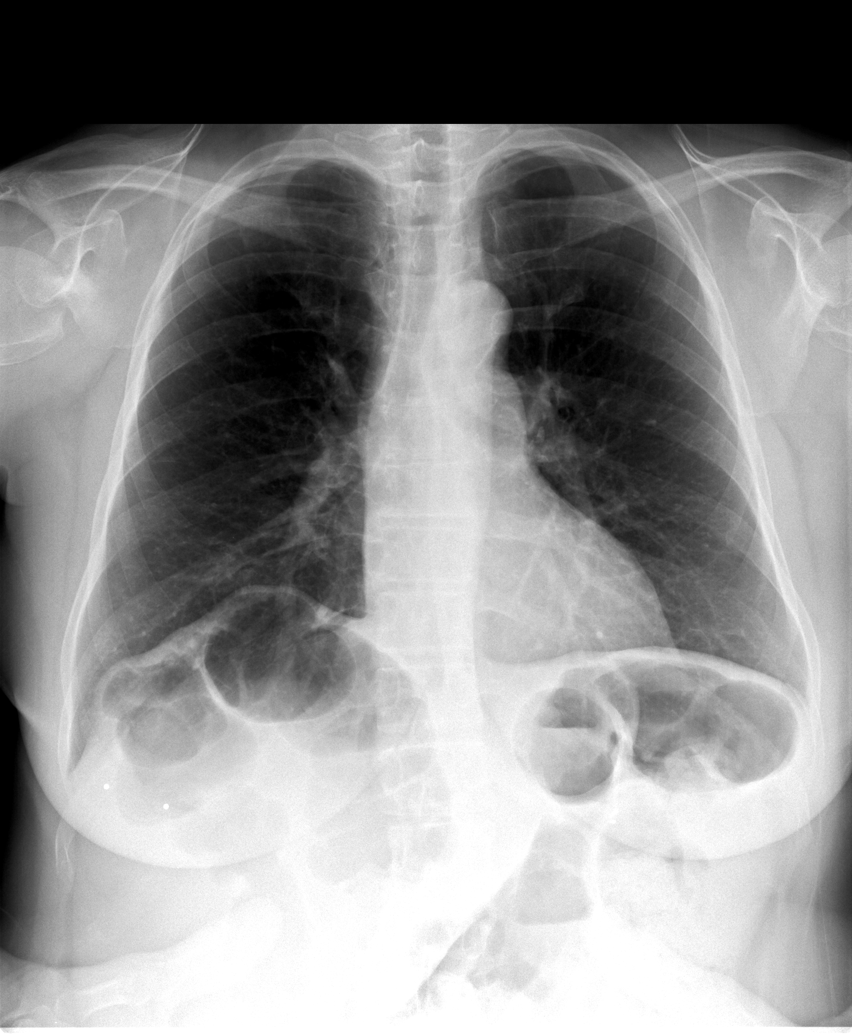

[view not recorded (2 of 5)]
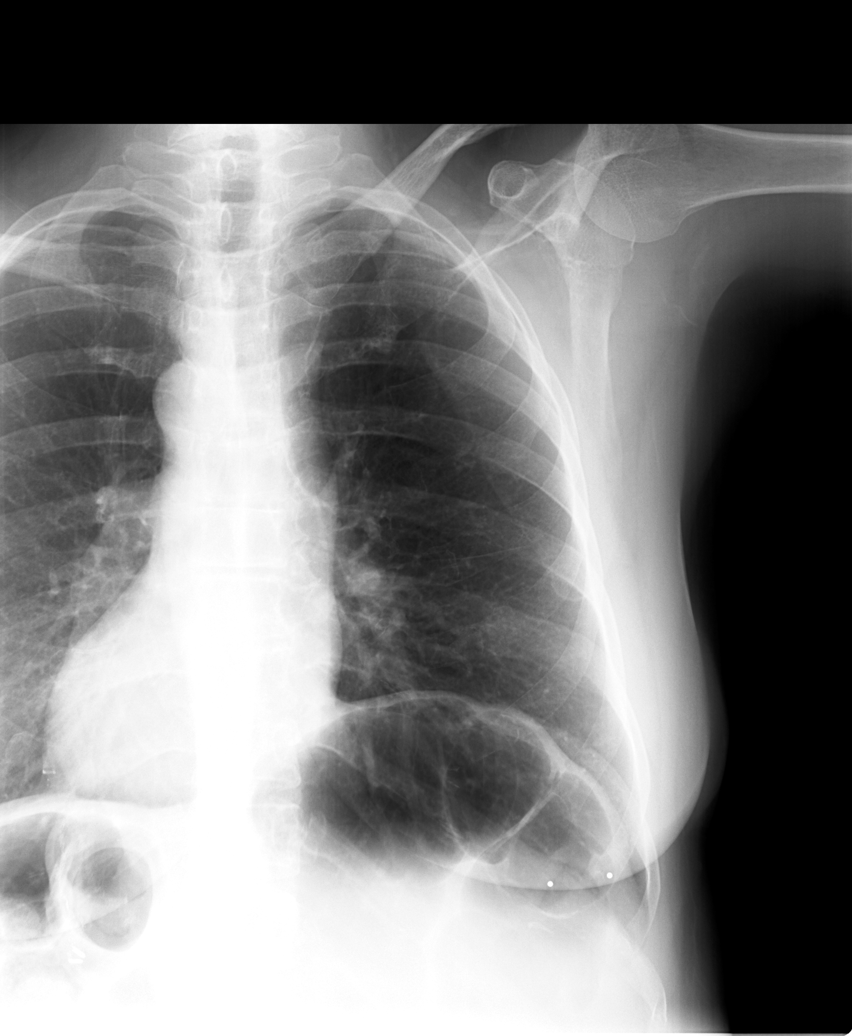

[view not recorded (3 of 5)]
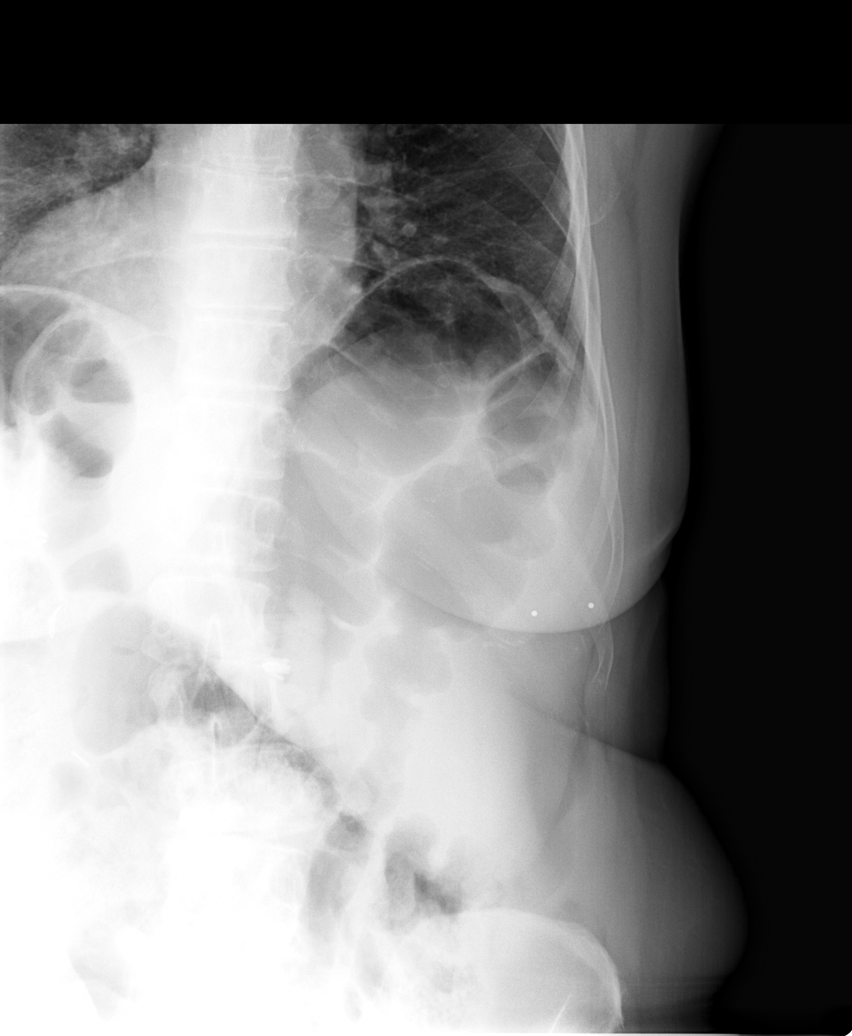

[view not recorded (4 of 5)]
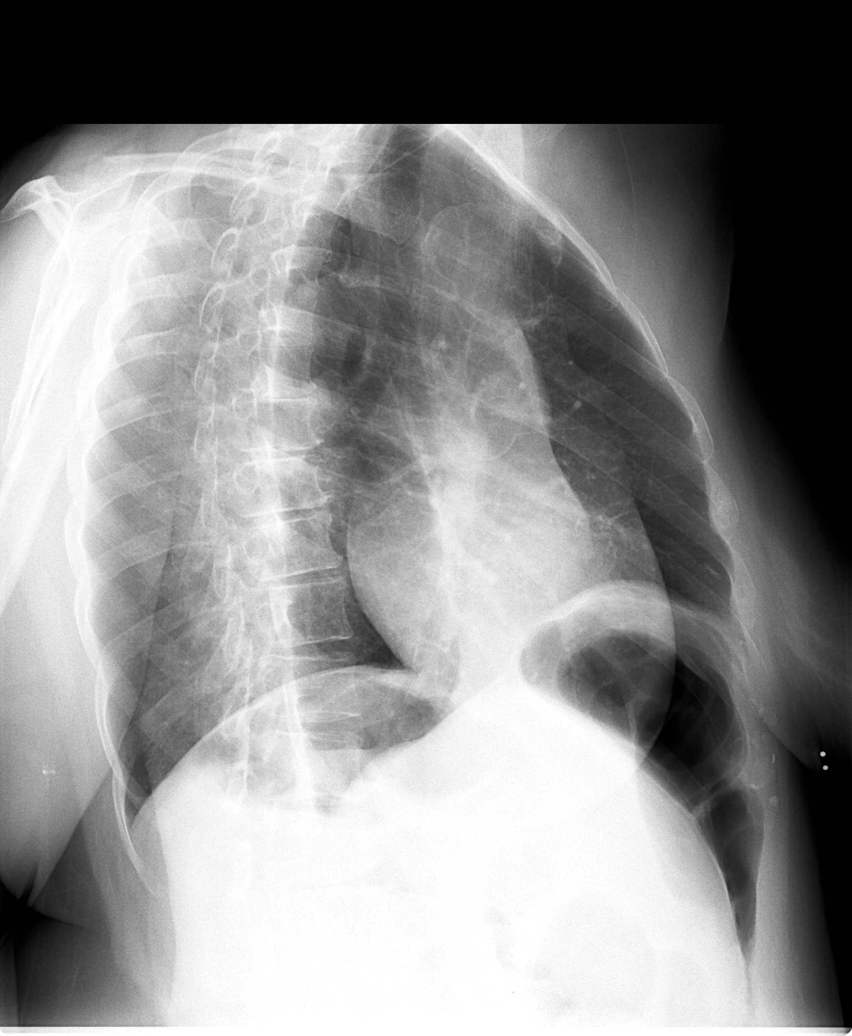

[view not recorded (5 of 5)]
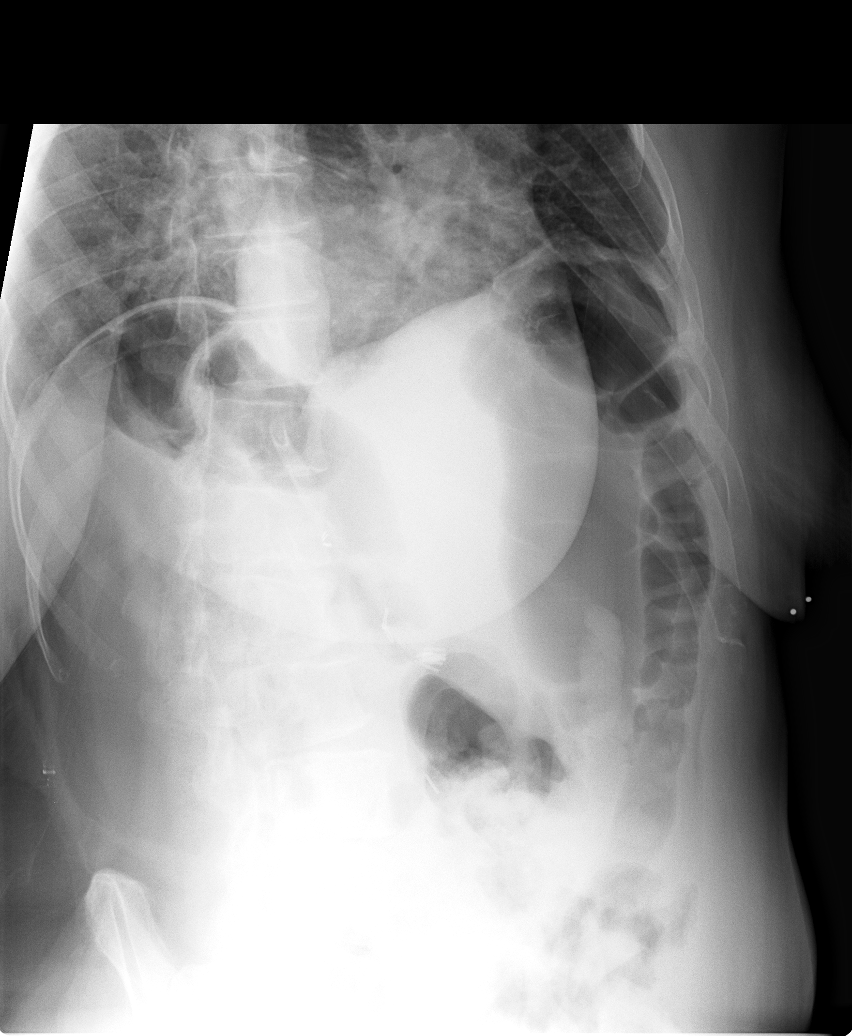

[5 of 5 positions shown; findings below may reference images not displayed]

FINDINGS: No fracture or other bone lesions are seen involving the ribs.
IMPRESSION: Negative.

## 2016-07-23 ENCOUNTER — Ambulatory Visit: Payer: 59 | Admitting: Dietician

## 2016-08-05 MED FILL — BUPROPION HCL XL 150 MG TAB: 150 | 90 days supply | Qty: 270 | Fill #1

## 2016-08-05 MED FILL — RESTASIS MULTIDOSE 0.05% EY: 0.05 | 55 days supply | Qty: 6 | Fill #2

## 2016-08-05 MED FILL — DULoxetine HCL 60 MG CPEP: 60 | 90 days supply | Qty: 90 | Fill #1

## 2016-08-13 DIAGNOSIS — H40023 Open angle with borderline findings, high risk, bilateral: Secondary | ICD-10-CM | POA: Diagnosis not present

## 2016-08-13 DIAGNOSIS — H04123 Dry eye syndrome of bilateral lacrimal glands: Secondary | ICD-10-CM | POA: Diagnosis not present

## 2016-08-13 DIAGNOSIS — H40053 Ocular hypertension, bilateral: Secondary | ICD-10-CM | POA: Diagnosis not present

## 2016-08-13 DIAGNOSIS — H2513 Age-related nuclear cataract, bilateral: Secondary | ICD-10-CM | POA: Diagnosis not present

## 2016-08-13 DIAGNOSIS — H11153 Pinguecula, bilateral: Secondary | ICD-10-CM | POA: Diagnosis not present

## 2016-08-27 ENCOUNTER — Encounter: Payer: Self-pay | Admitting: Internal Medicine

## 2016-08-27 ENCOUNTER — Ambulatory Visit (INDEPENDENT_AMBULATORY_CARE_PROVIDER_SITE_OTHER): Payer: 59 | Admitting: Internal Medicine

## 2016-08-27 VITALS — BP 136/90 | HR 83 | Temp 98.3°F | Resp 20 | Wt 196.0 lb

## 2016-08-27 DIAGNOSIS — J0110 Acute frontal sinusitis, unspecified: Secondary | ICD-10-CM

## 2016-08-27 MED ORDER — HYDROCODONE-HOMATROPINE 5-1.5 MG/5ML PO SYRP: 5.0000 mL | ORAL_SOLUTION | Freq: Every evening | ORAL | 0 refills | Status: DC | PRN

## 2016-08-27 MED ORDER — AMOXICILLIN 500 MG PO TABS: 1000.0000 mg | ORAL_TABLET | Freq: Two times a day (BID) | ORAL | 0 refills | Status: AC

## 2016-08-27 MED ORDER — PREDNISONE 20 MG PO TABS: 40.0000 mg | ORAL_TABLET | Freq: Every day | ORAL | 0 refills | Status: DC

## 2016-08-27 NOTE — Assessment & Plan Note (Signed)
Also has secondary bronchospasm---with questionable asthma history Will try 3 days of prednisone Narcotic cough syrup for bedtime Amoxicillin for the sinus infection

## 2016-08-27 NOTE — Progress Notes (Signed)
Subjective:    Patient ID: Laura Mccarthy, female    DOB: Sep 16, 1956, 60 y.o.   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 acetaminophen (TYLENOL) 500 MG tablet Take 1,000 mg by mouth every 6 (six) hours as needed for mild pain or moderate pain.    Marland Kitchen amphetamine-dextroamphetamine (ADDERALL) 15 MG tablet Take 30 mg by mouth every morning.     . Biotin 1000 MCG tablet Take 1,000 mcg by mouth daily.    Marland Kitchen buPROPion (WELLBUTRIN) 75 MG tablet Take 2 tablets (150 mg total) by mouth 3 (three) times daily. 90 tablet 0  . DULoxetine (CYMBALTA) 60 MG capsule Take 60 mg by mouth every morning.    . Multiple Vitamin (MULTIVITAMIN) tablet Take 1 tablet by mouth daily.    . vitamin B-12 (CYANOCOBALAMIN) 1000 MCG tablet Take 1,000 mcg by mouth daily.     No current facility-administered medications on file prior to visit.     No Known Allergies  Past Medical History:  Diagnosis Date  . ADD (attention deficit disorder)    takes Adderall daily  . Asthma    controlled-no meds  . Complication of anesthesia   . Depression   . GERD (gastroesophageal reflux disease)   . Headache(784.0)    migraines every 3 months or so- no special meds  . PONV (postoperative nausea and vomiting)     Past Surgical History:  Procedure Laterality Date  . CHOLECYSTECTOMY    . LAPAROSCOPIC GASTRIC SLEEVE RESECTION N/A 05/24/2014   Procedure: LAPAROSCOPIC GASTRIC SLEEVE RESECTION UPPER ENDO;   Surgeon: Gayland Curry, MD;  Location: [facility];  Service: General;  Laterality: N/A;  . NEUROPLASTY / TRANSPOSITION MEDIAN NERVE AT CARPAL TUNNEL BILATERAL    . UPPER GASTROINTESTINAL ENDOSCOPY    . UPPER GI ENDOSCOPY  05/24/2014   Procedure: UPPER GI ENDOSCOPY;  Surgeon: Gayland Curry, MD;  Location: [facility];  Service: General;;    Family History  Problem Relation Age of Onset  . Heart disease Mother   . Heart disease Father   . Diabetes Sister   . Colon cancer Neg Hx   . Colon polyps Neg Hx   . Esophageal cancer Neg Hx   . Rectal cancer Neg Hx   . Stomach cancer Neg Hx     Social History   Social History  . Marital status: Married    Spouse name: N/A  . Number of children: N/A  . Years of education: N/A   Occupational History  . Not on file.   Social History Main Topics  . Smoking status: Never Smoker  . Smokeless tobacco: Never Used  . Alcohol use 1.2 oz/week    2 Standard drinks or equivalent per week     Comment: occ. to rare social  . Drug use: No  . Sexual activity: Yes   Other Topics Concern  . Not on file   Social History Narrative  . No narrative on file   Review of Systems  No rash No vomiting or diarrhea Appetite is off Hasn't missed work     Objective:   Physical Exam  Constitutional: She appears well-developed. No distress.  Coarse cough  HENT:  Mild frontal sinus tenderness 1+ tonsils with slight injection TMs normal Moderate nasal inflammation  Neck: Normal range of motion. Neck supple.  Pulmonary/Chest: Effort normal. No respiratory distress. She has no rales.  Mild exp phase prolongation and wheezes  Lymphadenopathy:    She has no cervical adenopathy.          Assessment & Plan:

## 2016-08-27 NOTE — Progress Notes (Signed)
Pre visit review using our clinic review tool, if applicable. No additional management support is needed unless otherwise documented below in the visit note. 

## 2016-09-24 MED FILL — RESTASIS MULTIDOSE 0.05% EY: 0.05 | 55 days supply | Qty: 6 | Fill #3

## 2016-10-15 MED FILL — D-AMPHETAMINE ER 15 MG CAP: 15 | 90 days supply | Qty: 360 | Fill #0

## 2016-10-29 ENCOUNTER — Other Ambulatory Visit: Payer: Self-pay | Admitting: Family Medicine

## 2016-10-29 DIAGNOSIS — Z1231 Encounter for screening mammogram for malignant neoplasm of breast: Secondary | ICD-10-CM

## 2016-11-26 MED FILL — BUPROPION HCL XL 150 MG TAB: 150 | 90 days supply | Qty: 270 | Fill #2

## 2016-11-26 MED FILL — DULoxetine HCL 60 MG CPEP: 60 | 90 days supply | Qty: 90 | Fill #2

## 2016-11-28 MED FILL — RESTASIS MULTIDOSE 0.05% EY: 0.05 | 30 days supply | Qty: 6 | Fill #0

## 2016-12-03 ENCOUNTER — Ambulatory Visit
Admission: RE | Admit: 2016-12-03 | Discharge: 2016-12-03 | Disposition: A | Discharge status: Home / Self Care | Payer: 59 | Source: Ambulatory Visit | Attending: Family Medicine | Admitting: Family Medicine

## 2016-12-03 DIAGNOSIS — Z1231 Encounter for screening mammogram for malignant neoplasm of breast: Secondary | ICD-10-CM | POA: Diagnosis not present

## 2016-12-09 DIAGNOSIS — Z85828 Personal history of other malignant neoplasm of skin: Secondary | ICD-10-CM | POA: Diagnosis not present

## 2016-12-09 DIAGNOSIS — L570 Actinic keratosis: Secondary | ICD-10-CM | POA: Diagnosis not present

## 2016-12-09 DIAGNOSIS — L814 Other melanin hyperpigmentation: Secondary | ICD-10-CM | POA: Diagnosis not present

## 2016-12-09 DIAGNOSIS — D225 Melanocytic nevi of trunk: Secondary | ICD-10-CM | POA: Diagnosis not present

## 2016-12-09 DIAGNOSIS — L7211 Pilar cyst: Secondary | ICD-10-CM | POA: Diagnosis not present

## 2016-12-09 DIAGNOSIS — L821 Other seborrheic keratosis: Secondary | ICD-10-CM | POA: Diagnosis not present

## 2016-12-09 DIAGNOSIS — L820 Inflamed seborrheic keratosis: Secondary | ICD-10-CM | POA: Diagnosis not present

## 2017-02-08 MED FILL — RESTASIS MULTIDOSE 0.05% EY: 0.05 | 30 days supply | Qty: 6 | Fill #1

## 2017-02-11 DIAGNOSIS — H2513 Age-related nuclear cataract, bilateral: Secondary | ICD-10-CM | POA: Diagnosis not present

## 2017-02-11 DIAGNOSIS — H04123 Dry eye syndrome of bilateral lacrimal glands: Secondary | ICD-10-CM | POA: Diagnosis not present

## 2017-02-11 DIAGNOSIS — H52223 Regular astigmatism, bilateral: Secondary | ICD-10-CM | POA: Diagnosis not present

## 2017-02-11 DIAGNOSIS — H40053 Ocular hypertension, bilateral: Secondary | ICD-10-CM | POA: Diagnosis not present

## 2017-02-11 DIAGNOSIS — H524 Presbyopia: Secondary | ICD-10-CM | POA: Diagnosis not present

## 2017-02-11 DIAGNOSIS — H11153 Pinguecula, bilateral: Secondary | ICD-10-CM | POA: Diagnosis not present

## 2017-02-21 MED FILL — D-AMPHETAMINE ER 15 MG CAP: 15 | 90 days supply | Qty: 360 | Fill #0

## 2017-03-12 MED FILL — BUPROPION XL 150 MG TAB: 150 | 90 days supply | Qty: 270 | Fill #3

## 2017-03-12 MED FILL — DULoxetine HCL 60 MG CPEP: 60 | 90 days supply | Qty: 90 | Fill #3

## 2017-06-11 MED FILL — DULoxetine HCL 60 MG CPEP: 60 | 90 days supply | Qty: 90 | Fill #0

## 2017-06-11 MED FILL — BUPROPION HCL XL 150 MG TAB: 150 | 90 days supply | Qty: 270 | Fill #0

## 2017-07-17 MED FILL — D-AMPHETAMINE ER 15 MG CAP: 15 | 30 days supply | Qty: 120 | Fill #0

## 2017-08-19 DIAGNOSIS — H52223 Regular astigmatism, bilateral: Secondary | ICD-10-CM | POA: Diagnosis not present

## 2017-08-19 DIAGNOSIS — H2513 Age-related nuclear cataract, bilateral: Secondary | ICD-10-CM | POA: Diagnosis not present

## 2017-08-19 DIAGNOSIS — H40053 Ocular hypertension, bilateral: Secondary | ICD-10-CM | POA: Diagnosis not present

## 2017-08-19 DIAGNOSIS — H04123 Dry eye syndrome of bilateral lacrimal glands: Secondary | ICD-10-CM | POA: Diagnosis not present

## 2017-08-19 DIAGNOSIS — H40023 Open angle with borderline findings, high risk, bilateral: Secondary | ICD-10-CM | POA: Diagnosis not present

## 2017-08-19 DIAGNOSIS — H524 Presbyopia: Secondary | ICD-10-CM | POA: Diagnosis not present

## 2017-08-19 DIAGNOSIS — H11153 Pinguecula, bilateral: Secondary | ICD-10-CM | POA: Diagnosis not present

## 2017-08-22 MED FILL — buPROPion HCL ER (XL) 150 M: 150 | 90 days supply | Qty: 270 | Fill #0

## 2017-08-22 MED FILL — DULoxetine HCL 60 MG CPEP: 60 | 90 days supply | Qty: 90 | Fill #0

## 2017-09-09 MED FILL — D-AMPHETAMINE ER 15 MG CAP: 15 | 90 days supply | Qty: 360 | Fill #0

## 2017-12-22 ENCOUNTER — Encounter (HOSPITAL_COMMUNITY): Payer: Self-pay

## 2017-12-26 ENCOUNTER — Other Ambulatory Visit: Payer: Self-pay | Admitting: Family Medicine

## 2017-12-26 DIAGNOSIS — Z139 Encounter for screening, unspecified: Secondary | ICD-10-CM

## 2018-01-01 MED FILL — DULoxetine HCL 60 MG CPEP: 60 | 90 days supply | Qty: 90 | Fill #1

## 2018-01-01 MED FILL — buPROPion HCL ER (XL) 150 M: 150 | 90 days supply | Qty: 270 | Fill #1

## 2018-01-20 ENCOUNTER — Ambulatory Visit
Admission: RE | Admit: 2018-01-20 | Discharge: 2018-01-20 | Disposition: A | Discharge status: Home / Self Care | Payer: 59 | Source: Ambulatory Visit | Attending: Family Medicine | Admitting: Family Medicine

## 2018-01-20 DIAGNOSIS — Z139 Encounter for screening, unspecified: Secondary | ICD-10-CM

## 2018-01-20 DIAGNOSIS — Z1231 Encounter for screening mammogram for malignant neoplasm of breast: Secondary | ICD-10-CM | POA: Diagnosis not present

## 2018-02-10 MED FILL — D-AMPHETAMINE ER 15 MG CAP: 15 | 90 days supply | Qty: 360 | Fill #0

## 2018-02-17 DIAGNOSIS — H40023 Open angle with borderline findings, high risk, bilateral: Secondary | ICD-10-CM | POA: Diagnosis not present

## 2018-02-17 DIAGNOSIS — H04123 Dry eye syndrome of bilateral lacrimal glands: Secondary | ICD-10-CM | POA: Diagnosis not present

## 2018-02-17 DIAGNOSIS — H11153 Pinguecula, bilateral: Secondary | ICD-10-CM | POA: Diagnosis not present

## 2018-02-17 DIAGNOSIS — H40053 Ocular hypertension, bilateral: Secondary | ICD-10-CM | POA: Diagnosis not present

## 2018-02-17 DIAGNOSIS — H2513 Age-related nuclear cataract, bilateral: Secondary | ICD-10-CM | POA: Diagnosis not present

## 2018-02-17 MED FILL — RESTASIS 0.05% EYE EMULSION: 0.05 | 90 days supply | Qty: 180 | Fill #0

## 2018-03-17 DIAGNOSIS — H2513 Age-related nuclear cataract, bilateral: Secondary | ICD-10-CM | POA: Diagnosis not present

## 2018-03-17 DIAGNOSIS — H11153 Pinguecula, bilateral: Secondary | ICD-10-CM | POA: Diagnosis not present

## 2018-03-17 DIAGNOSIS — H40053 Ocular hypertension, bilateral: Secondary | ICD-10-CM | POA: Diagnosis not present

## 2018-03-17 DIAGNOSIS — H40023 Open angle with borderline findings, high risk, bilateral: Secondary | ICD-10-CM | POA: Diagnosis not present

## 2018-03-17 MED FILL — BRIMONIDINE 0.2% EYE DROP: 0.2 | 30 days supply | Qty: 5 | Fill #0

## 2018-04-11 MED FILL — DULoxetine HCL 60 MG CPEP: 60 | 90 days supply | Qty: 90 | Fill #2

## 2018-04-13 MED FILL — buPROPion HCL ER (XL) 150 M: 150 | 90 days supply | Qty: 270 | Fill #2

## 2018-04-14 MED FILL — BRIMONIDINE 0.2% EYE DROP: 0.2 | 25 days supply | Qty: 5 | Fill #1

## 2018-04-21 ENCOUNTER — Ambulatory Visit: Payer: 59 | Admitting: Family Medicine

## 2018-04-21 DIAGNOSIS — H40023 Open angle with borderline findings, high risk, bilateral: Secondary | ICD-10-CM | POA: Diagnosis not present

## 2018-04-21 DIAGNOSIS — H40053 Ocular hypertension, bilateral: Secondary | ICD-10-CM | POA: Diagnosis not present

## 2018-05-13 ENCOUNTER — Ambulatory Visit: Payer: 59 | Admitting: Obstetrics and Gynecology

## 2018-05-13 ENCOUNTER — Encounter: Payer: Self-pay | Admitting: Obstetrics and Gynecology

## 2018-05-13 ENCOUNTER — Other Ambulatory Visit: Payer: Self-pay

## 2018-05-13 ENCOUNTER — Other Ambulatory Visit (HOSPITAL_COMMUNITY)
Admission: RE | Admit: 2018-05-13 | Discharge: 2018-05-13 | Disposition: A | Discharge status: Home / Self Care | Payer: 59 | Source: Ambulatory Visit | Attending: Obstetrics and Gynecology | Admitting: Obstetrics and Gynecology

## 2018-05-13 VITALS — BP 138/80 | HR 104 | Resp 16 | Ht 63.25 in | Wt 207.0 lb

## 2018-05-13 DIAGNOSIS — Z9884 Bariatric surgery status: Secondary | ICD-10-CM

## 2018-05-13 DIAGNOSIS — Z0000 Encounter for general adult medical examination without abnormal findings: Secondary | ICD-10-CM | POA: Diagnosis not present

## 2018-05-13 DIAGNOSIS — Z124 Encounter for screening for malignant neoplasm of cervix: Secondary | ICD-10-CM | POA: Diagnosis not present

## 2018-05-13 DIAGNOSIS — Z01419 Encounter for gynecological examination (general) (routine) without abnormal findings: Secondary | ICD-10-CM | POA: Diagnosis not present

## 2018-05-13 DIAGNOSIS — Z6836 Body mass index (BMI) 36.0-36.9, adult: Secondary | ICD-10-CM

## 2018-05-13 NOTE — Progress Notes (Signed)
62 y.o. G0P0000 MarriedCaucasianF here for annual exam.  No vaginal bleeding. No dyspareunia.   Gastric sleeve in 2015, got down to 178. Has gained about 30 lbs. High weight was 249.     No LMP recorded. Patient is postmenopausal.          Sexually active: Yes.    The current method of family planning is post menopausal status.    Exercising: No.  The patient does not participate in regular exercise at present. Smoker:  no  Health Maintenance: Pap:  2014 History of abnormal Pap:  Yes 2014 colposcopy, no cervical surgery.   MMG:  01-21-18 WNL  Colonoscopy:  12-19-15 polyps repeat in 3 yrs  BMD:   Never TDaP:  Up to date Gardasil: N/A   reports that she has never smoked. She has never used smokeless tobacco. She reports that she drinks alcohol. She reports that she does not use drugs. 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 She has 4 step-children, 2 grandchildren, 4 and 6.   Past Medical History:  Diagnosis Date  . ADD (attention deficit disorder)    takes Adderall daily  . Asthma    controlled-no meds  . Complication of anesthesia   . Depression   . GERD (gastroesophageal reflux disease)   . Headache(784.0)    migraines every 3 months or so- no special meds  . PONV (postoperative nausea and vomiting)     Past Surgical History:  Procedure Laterality Date  . CHOLECYSTECTOMY    . LAPAROSCOPIC GASTRIC SLEEVE RESECTION N/A 05/24/2014   Procedure: LAPAROSCOPIC GASTRIC SLEEVE RESECTION UPPER ENDO;  Surgeon: Gayland Curry, MD;  Location: [facility];  Service: General;  Laterality: N/A;  . NEUROPLASTY / TRANSPOSITION MEDIAN NERVE AT CARPAL TUNNEL BILATERAL    . UPPER GASTROINTESTINAL ENDOSCOPY    . UPPER GI ENDOSCOPY  05/24/2014   Procedure: UPPER GI ENDOSCOPY;  Surgeon: Gayland Curry, MD;  Location: [facility];  Service: General;;    Current Outpatient Medications  Medication Sig Dispense Refill  . acetaminophen (TYLENOL) 500 MG tablet Take 1,000 mg by mouth every 6 (six)  hours as needed for mild pain or moderate pain.    Marland Kitchen amphetamine-dextroamphetamine (ADDERALL) 15 MG tablet Take 30 mg by mouth every morning.     Marland Kitchen buPROPion (WELLBUTRIN) 75 MG tablet Take 2 tablets (150 mg total) by mouth 3 (three) times daily. 90 tablet 0  . DULoxetine (CYMBALTA) 60 MG capsule Take 60 mg by mouth every morning.     No current facility-administered medications for this visit.   Med's prescribed by Psychiatry.   Family History  Problem Relation Age of Onset  . Heart disease Mother   . Heart disease Father   . Diabetes Sister   . Colon cancer Neg Hx   . Colon polyps Neg Hx   . Esophageal cancer Neg Hx   . Rectal cancer Neg Hx   . Stomach cancer Neg Hx     Review of Systems  Constitutional: Negative.   HENT: Negative.   Eyes: Negative.   Respiratory: Negative.   Cardiovascular: Negative.   Gastrointestinal: Negative.   Endocrine: Negative.   Genitourinary: Negative.   Musculoskeletal: Negative.   Skin: Negative.   Allergic/Immunologic: Negative.   Neurological: Negative.   Psychiatric/Behavioral: Negative.     Exam:   BP 138/80 (BP Location: Right Arm, Patient Position: Sitting, Cuff Size: Normal)   Pulse (!) 104   Resp 16   Ht 5' 3.25" (  1.607 m)   Wt 207 lb (93.9 kg)   BMI 36.38 kg/m   Weight change: @WEIGHTCHANGE @ Height:   Height: 5' 3.25" (160.7 cm)  Ht Readings from Last 3 Encounters:  05/13/18 5' 3.25" (1.607 m)  01/24/16 5\' 5"  (1.651 m)  12/19/15 5\' 4"  (1.626 m)    General appearance: alert, cooperative and appears stated age Head: Normocephalic, without obvious abnormality, atraumatic Neck: no adenopathy, supple, symmetrical, trachea midline and thyroid normal to inspection and palpation Lungs: clear to auscultation bilaterally Cardiovascular: regular rate and rhythm Breasts: normal appearance, no masses or tenderness Abdomen: soft, non-tender; non distended,  no masses,  no organomegaly Extremities: extremities normal, atraumatic, no  cyanosis or edema Skin: Skin color, texture, turgor normal. No rashes or lesions Lymph nodes: Cervical, supraclavicular, and axillary nodes normal. No abnormal inguinal nodes palpated Neurologic: Grossly normal   Pelvic: External genitalia:  no lesions              Urethra:  normal appearing urethra with no masses, tenderness or lesions              Bartholins and Skenes: normal                 Vagina: normal appearing vagina with normal color and discharge, no lesions              Cervix: no lesions               Bimanual Exam:  Uterus:  normal size, contour, position, consistency, mobility, non-tender              Adnexa: no mass, fullness, tenderness               Rectovaginal: Confirms               Anus:  normal sphincter tone, no lesions  Chaperone was present for exam.  A:  Well Woman with normal exam  H/O gastric sleeve  BMI 36  P:   Screening labs  Pap with hpv  Discussed breast self exam  Discussed calcium and vit D intake

## 2018-05-13 NOTE — Patient Instructions (Signed)
EXERCISE AND DIET:  We recommended that you start or continue a regular exercise program for good health. Regular exercise means any activity that makes your heart beat faster and makes you sweat.  We recommend exercising at least 30 minutes per day at least 3 days a week, preferably 4 or 5.  We also recommend a diet low in fat and sugar.  Inactivity, poor dietary choices and obesity can cause diabetes, heart attack, stroke, and kidney damage, among others.    ALCOHOL AND SMOKING:  Women should limit their alcohol intake to no more than 7 drinks/beers/glasses of wine (combined, not each!) per week. Moderation of alcohol intake to this level decreases your risk of breast cancer and liver damage. And of course, no recreational drugs are part of a healthy lifestyle.  And absolutely no smoking or even second hand smoke. Most people know smoking can cause heart and lung diseases, but did you know it also contributes to weakening of your bones? Aging of your skin?  Yellowing of your teeth and nails?  CALCIUM AND VITAMIN D:  Adequate intake of calcium and Vitamin D are recommended.  The recommendations for exact amounts of these supplements seem to change often, but generally speaking 600 mg of calcium (either carbonate or citrate) and 800 units of Vitamin D per day seems prudent. Certain women may benefit from higher intake of Vitamin D.  If you are among these women, your doctor will have told you during your visit.    PAP SMEARS:  Pap smears, to check for cervical cancer or precancers,  have traditionally been done yearly, although recent scientific advances have shown that most women can have pap smears less often.  However, every woman still should have a physical exam from her gynecologist every year. It will include a breast check, inspection of the vulva and vagina to check for abnormal growths or skin changes, a visual exam of the cervix, and then an exam to evaluate the size and shape of the uterus and  ovaries.  And after 63 years of age, a rectal exam is indicated to check for rectal cancers. We will also provide age appropriate advice regarding health maintenance, like when you should have certain vaccines, screening for sexually transmitted diseases, bone density testing, colonoscopy, mammograms, etc.   MAMMOGRAMS:  All women over 40 years old should have a yearly mammogram. Many facilities now offer a "3D" mammogram, which may cost around $50 extra out of pocket. If possible,  we recommend you accept the option to have the 3D mammogram performed.  It both reduces the number of women who will be called back for extra views which then turn out to be normal, and it is better than the routine mammogram at detecting truly abnormal areas.    COLONOSCOPY:  Colonoscopy to screen for colon cancer is recommended for all women at age 50.  We know, you hate the idea of the prep.  We agree, BUT, having colon cancer and not knowing it is worse!!  Colon cancer so often starts as a polyp that can be seen and removed at colonscopy, which can quite literally save your life!  And if your first colonoscopy is normal and you have no family history of colon cancer, most women don't have to have it again for 10 years.  Once every ten years, you can do something that may end up saving your life, right?  We will be happy to help you get it scheduled when you are ready.    Be sure to check your insurance coverage so you understand how much it will cost.  It may be covered as a preventative service at no cost, but you should check your particular policy.      Breast Self-Awareness Breast self-awareness means being familiar with how your breasts look and feel. It involves checking your breasts regularly and reporting any changes to your health care provider. Practicing breast self-awareness is important. A change in your breasts can be a sign of a serious medical problem. Being familiar with how your breasts look and feel allows  you to find any problems early, when treatment is more likely to be successful. All women should practice breast self-awareness, including women who have had breast implants. How to do a breast self-exam One way to learn what is normal for your breasts and whether your breasts are changing is to do a breast self-exam. To do a breast self-exam: Look for Changes  1. Remove all the clothing above your waist. 2. Stand in front of a mirror in a room with good lighting. 3. Put your hands on your hips. 4. Push your hands firmly downward. 5. Compare your breasts in the mirror. Look for differences between them (asymmetry), such as: ? Differences in shape. ? Differences in size. ? Puckers, dips, and bumps in one breast and not the other. 6. Look at each breast for changes in your skin, such as: ? Redness. ? Scaly areas. 7. Look for changes in your nipples, such as: ? Discharge. ? Bleeding. ? Dimpling. ? Redness. ? A change in position. Feel for Changes  Carefully feel your breasts for lumps and changes. It is best to do this while lying on your back on the floor and again while sitting or standing in the shower or tub with soapy water on your skin. Feel each breast in the following way:  Place the arm on the side of the breast you are examining above your head.  Feel your breast with the other hand.  Start in the nipple area and make  inch (2 cm) overlapping circles to feel your breast. Use the pads of your three middle fingers to do this. Apply light pressure, then medium pressure, then firm pressure. The light pressure will allow you to feel the tissue closest to the skin. The medium pressure will allow you to feel the tissue that is a little deeper. The firm pressure will allow you to feel the tissue close to the ribs.  Continue the overlapping circles, moving downward over the breast until you feel your ribs below your breast.  Move one finger-width toward the center of the body.  Continue to use the  inch (2 cm) overlapping circles to feel your breast as you move slowly up toward your collarbone.  Continue the up and down exam using all three pressures until you reach your armpit.  Write Down What You Find  Write down what is normal for each breast and any changes that you find. Keep a written record with breast changes or normal findings for each breast. By writing this information down, you do not need to depend only on memory for size, tenderness, or location. Write down where you are in your menstrual cycle, if you are still menstruating. If you are having trouble noticing differences in your breasts, do not get discouraged. With time you will become more familiar with the variations in your breasts and more comfortable with the exam. How often should I examine my breasts? Examine   your breasts every month. If you are breastfeeding, the best time to examine your breasts is after a feeding or after using a breast pump. If you menstruate, the best time to examine your breasts is 5-7 days after your period is over. During your period, your breasts are lumpier, and it may be more difficult to notice changes. When should I see my health care provider? See your health care provider if you notice:  A change in shape or size of your breasts or nipples.  A change in the skin of your breast or nipples, such as a reddened or scaly area.  Unusual discharge from your nipples.  A lump or thick area that was not there before.  Pain in your breasts.  Anything that concerns you.  This information is not intended to replace advice given to you by your health care provider. Make sure you discuss any questions you have with your health care provider. Document Released: 11/11/2005 Document Revised: 04/18/2016 Document Reviewed: 10/01/2015 Elsevier Interactive Patient Education  2018 [employer]vier Inc.  

## 2018-05-15 LAB — CYTOLOGY - PAP
Diagnosis: NEGATIVE
HPV (WINDOPATH): NOT DETECTED

## 2018-05-16 LAB — LIPID PANEL
CHOLESTEROL TOTAL: 169 mg/dL (ref 100–199)
Chol/HDL Ratio: 3 ratio (ref 0.0–4.4)
HDL: 56 mg/dL (ref >39)
LDL Calculated: 98 mg/dL (ref 0–99)
TRIGLYCERIDES: 76 mg/dL (ref 0–149)
VLDL CHOLESTEROL CAL: 15 mg/dL (ref 5–40)

## 2018-05-16 LAB — HEMOGLOBIN A1C
ESTIMATED AVERAGE GLUCOSE: 108 mg/dL
Hgb A1c MFr Bld: 5.4 % (ref 4.8–5.6)

## 2018-05-16 LAB — COMPREHENSIVE METABOLIC PANEL
ALT: 21 IU/L (ref 0–32)
AST: 20 IU/L (ref 0–40)
Albumin/Globulin Ratio: 1.7 (ref 1.2–2.2)
Albumin: 4.2 g/dL (ref 3.6–4.8)
Alkaline Phosphatase: 80 IU/L (ref 39–117)
BUN/Creatinine Ratio: 26 (ref 12–28)
BUN: 21 mg/dL (ref 8–27)
Bilirubin Total: 0.3 mg/dL (ref 0.0–1.2)
CALCIUM: 9.1 mg/dL (ref 8.7–10.3)
CO2: 23 mmol/L (ref 20–29)
CREATININE: 0.8 mg/dL (ref 0.57–1.00)
Chloride: 103 mmol/L (ref 96–106)
GFR, EST AFRICAN AMERICAN: 91 mL/min/{1.73_m2} (ref >59)
GFR, EST NON AFRICAN AMERICAN: 79 mL/min/{1.73_m2} (ref >59)
Globulin, Total: 2.5 g/dL (ref 1.5–4.5)
Glucose: 95 mg/dL (ref 65–99)
Potassium: 3.8 mmol/L (ref 3.5–5.2)
Sodium: 143 mmol/L (ref 134–144)
TOTAL PROTEIN: 6.7 g/dL (ref 6.0–8.5)

## 2018-05-16 LAB — CBC
Hematocrit: 38.7 % (ref 34.0–46.6)
Hemoglobin: 11.7 g/dL (ref 11.1–15.9)
MCH: 26.5 pg — AB (ref 26.6–33.0)
MCHC: 30.2 g/dL — ABNORMAL LOW (ref 31.5–35.7)
MCV: 88 fL (ref 79–97)
PLATELETS: 360 10*3/uL (ref 150–450)
RBC: 4.42 x10E6/uL (ref 3.77–5.28)
RDW: 15.3 % (ref 12.3–15.4)
WBC: 8.4 10*3/uL (ref 3.4–10.8)

## 2018-05-16 LAB — B12 AND FOLATE PANEL
Folate: 13.8 ng/mL (ref >3.0)
Vitamin B-12: 528 pg/mL (ref 232–1245)

## 2018-05-16 LAB — VITAMIN D 25 HYDROXY (VIT D DEFICIENCY, FRACTURES): VIT D 25 HYDROXY: 27.7 ng/mL — AB (ref 30.0–100.0)

## 2018-05-16 LAB — VITAMIN B1: THIAMINE: 151.2 nmol/L (ref 66.5–200.0)

## 2018-05-16 LAB — IRON: IRON: 42 ug/dL (ref 27–139)

## 2018-05-16 LAB — TSH: TSH: 4.27 u[IU]/mL (ref 0.450–4.500)

## 2018-05-18 ENCOUNTER — Telehealth: Payer: Self-pay | Admitting: Obstetrics and Gynecology

## 2018-05-18 NOTE — Telephone Encounter (Signed)
Patient requesting that her [employer] be updated to say that she has gotten her annual.

## 2018-05-18 NOTE — Telephone Encounter (Signed)
Spoke with patient, questions answered. Patient aware this is not updated by provider. Will wait for insurance to process. Patient thankful for return call.   Routing to provider for final review. Patient is agreeable to disposition. Will close encounter.

## 2018-05-19 ENCOUNTER — Ambulatory Visit: Payer: 59 | Admitting: Family Medicine

## 2018-05-20 MED FILL — BRIMONIDINE 0.2% EYE DROP: 0.2 | 75 days supply | Qty: 15 | Fill #0

## 2018-05-26 ENCOUNTER — Ambulatory Visit: Payer: 59 | Admitting: Family Medicine

## 2018-06-02 ENCOUNTER — Ambulatory Visit (INDEPENDENT_AMBULATORY_CARE_PROVIDER_SITE_OTHER): Payer: 59 | Admitting: Family Medicine

## 2018-06-02 ENCOUNTER — Encounter: Payer: Self-pay | Admitting: Family Medicine

## 2018-06-02 DIAGNOSIS — E669 Obesity, unspecified: Secondary | ICD-10-CM | POA: Diagnosis not present

## 2018-06-02 NOTE — Progress Notes (Signed)
Medical Nutrition Therapy:  Appt start time: 1330 end time:  1430. Did not check weight today per pt request.   Assessment:  Primary concerns today: Weight management.  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  She lost weight well initially, from 249 to 178 lb, then started to regain, which she associates with sugar intake. She often finds it hard to resist sweets.  Identifies herself as a stress eater.  She has regained ~30 lb, mostly in the past year.    Learning Readiness: Ready  Usual eating pattern includes 3 meals (bkfst is protein drink) and 2 snacks per day. Frequent foods and beverages include 2 coffee/day w/ ~1/4 tsp sugar & ~2 tbsp flavored creamer, 30-48 oz swt tea/day; Premier protein drink, celery & pimiento chs.  Avoided foods include none.   Usual physical activity includes none currently other than during visits with grandchildren ~2 X month. Did not do much exercise post-surgery either, although she used to work out years ago, and would like to again.  Currently time constraints are a factor.    24-hr recall: (Up at 6 AM) B (6:15 AM)-   2 c coffee, 1/2 tsp sugar, 1/4 c flavored creamer B (7:30 AM)-  Premier protein drink (200 kcal)  Snk (11 AM)-   3/4 c blueberries, 1 c Fage plain Grk yogurt L (1 PM)-  Roast beef (3 oz)-provolone (2 oz) rollups  Snk ( PM)-  Water, finished AM snack D (7 PM)-  Fried ~6 oz battered fish, 2 hush puppies, 1/2 c slaw, water Snk (10 PM)-  5 Townhouse crackers Typical day? Yes.  except lunch is often celery with pimiento cheese, 24 oz water all day yesterday.    Progress Towards Goal(s):  In progress.   Nutritional Diagnosis:  [name]-3.3 Overweight/obesity As related to energy expenditure.  As evidenced by BMI >30.    Intervention:  Nutrition education  Handouts given during visit include:   After-Visit Summary (AVS)  Goals Sheet  Demonstrated degree of understanding via:  Teach Back  Barriers to learning/adherence to lifestyle change:  Cravings for sweets; stress eating.   Monitoring/Evaluation:  Dietary intake, exercise, and body weight in 4 week(s).

## 2018-06-02 NOTE — Patient Instructions (Addendum)
-   Re-start nutritional supplements ASAP.    - Appetite consists of both physical and emotional.  The easiest to control is your physical appetite.  You want to avoid getting physically over-hungry whenever you can.  This usually means "front-end" loading your day, getting enough food early in the day.  This also means sometimes "preventively."  For example, if you can eat lunch at a certain time, but are not hungry, you may want to eat at that time if you won't be able to eat till several hours later.   - Remember one of the killers of willpower is low blood sugar.    Goals: 1. Bring well planned lunches from home at least 4 days a week.    - Plan ahead: Set aside a few minutes on the weekend to plan lunches for the week; shop as needed.  2. Physical activity: At least walk 30 minutes 3 times a week.   3. Obtain twice the volume of vegetables as either protein or starchy foods for both lunch and dinner.      In addition:  Add a bit to breakfast, e.g., any kind of veg or fruit; string cheese; 1/4 to 1/3 cup unsalted nuts/seeds or trail mix (mix your own);

## 2018-07-03 MED FILL — D-AMPHETAMINE ER 15 MG CAP: 15 | 90 days supply | Qty: 360 | Fill #0

## 2018-07-07 ENCOUNTER — Ambulatory Visit (INDEPENDENT_AMBULATORY_CARE_PROVIDER_SITE_OTHER): Payer: 59 | Admitting: Family Medicine

## 2018-07-07 ENCOUNTER — Encounter: Payer: Self-pay | Admitting: Family Medicine

## 2018-07-07 NOTE — Patient Instructions (Addendum)
-   Go to Self-compassion.org.  Watch the TED Talk by Cristie Hem, PhD.  Take the quiz, and record your overall and 6 sub-scores, and bring them to follow-up appt.  Peruse the site for other videos and articles.    - Keep a record (hashmarks) with you at work, tracking how many times you eat a cookie during the day.    - Also, when you find some time at home, brainstorm alternate ways you can respond to that urge to have a cookie.    - Make a list of 7 dinner meals that taste good, are relatively quick and easy to prepare, and that meet your nutritional needs.  Use this as a basis for shopping, so you can make one of these meals any time.  Bring your list to your follow-up appointment for review.  Use the Meal Planning form provided today.    - Continue to get at least 30 minutes 3 X day.

## 2018-07-07 NOTE — Progress Notes (Signed)
Medical Nutrition Therapy:  Appt start time: 1330 end time:  1430.  Assessment:  Primary concerns today: Weight management.  Laura Mccarthy has bought her multivitamin, B6, and ??, but has not started taking them yet.  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  This translates into stress eating, both at work and at home.  There are cookies where [employer] works always available, and she finds herself eating them mindlessly at times.   Progress on goals: She was bringing lunches to work regularly until [employer] vacation 2 weeks ago.  Has not done the weekly planning time for meals and exercise.  She is meeting her goal of getting a lot of veg's at dinner, but not always at lunch.     Learning Readiness: Ready  Usual eating pattern includes 3 meals (bkfst is protein drink) and 2 snacks per day. Recent physical activity includes 30 min walking 3 X wk.    24-hr recall:  (Up at 6 AM; 2 c coffee, 4 tbsp flvrd creamer) B (7:30 AM)-  1 Premier protein drink (30 g pro, 160 kcal)  Snk (10:30)-  4 [employer] chx minis, water L (1 PM)-  Nachos w/ chx, chs, blk beans (drug rep lunch), 16 oz swt tea (drug rep lunch) Snk ( PM)-  water D (7 PM)-  3 chx wings, salad, lite feta drsng, water Snk ( PM)-  --- Typical day? No.  Lunch is more likely to be celery and pimiento chs or 2 or 3 chs and ham roll-ups; AM snack is usually string cheese or yogurt and berries.    Progress Towards Goal(s):  In progress.   Nutritional Diagnosis: No progress on [place]-3.3 Overweight/obesity As related to energy expenditure.  As evidenced by BMI >30.    Intervention:  Nutrition education  Handouts given during visit include:  After-Visit Summary (AVS)  Meal Planning form  Demonstrated degree of understanding via:  Teach Back  Barriers to learning/adherence to lifestyle change: Cravings for sweets; stress eating.    Monitoring/Evaluation:  Dietary intake, exercise, and body weight in 3 week(s).

## 2018-07-14 MED FILL — DULoxetine HCL 60 MG CPEP: 60 | 90 days supply | Qty: 90 | Fill #3

## 2018-07-14 MED FILL — buPROPion HCL ER (XL) 150 M: 150 | 90 days supply | Qty: 270 | Fill #3

## 2018-07-28 ENCOUNTER — Encounter: Payer: Self-pay | Admitting: Family Medicine

## 2018-07-28 ENCOUNTER — Ambulatory Visit (INDEPENDENT_AMBULATORY_CARE_PROVIDER_SITE_OTHER): Payer: 59 | Admitting: Family Medicine

## 2018-07-28 DIAGNOSIS — E669 Obesity, unspecified: Secondary | ICD-10-CM | POA: Diagnosis not present

## 2018-07-28 DIAGNOSIS — Z6836 Body mass index (BMI) 36.0-36.9, adult: Secondary | ICD-10-CM | POA: Diagnosis not present

## 2018-07-28 NOTE — Progress Notes (Signed)
Medical Nutrition Therapy:  Appt start time: 1330 end time:  1430.  Assessment:  Primary concerns today: Weight management.  Laura Mccarthy said she feels she has made some progress on her goals (see below).    Laura Mccarthy took the Self-compassion quiz at self-compassion.org site, but did not have her scores with her.  She did determine that she is hard on herself in terms of being judgmental and critical.    Progress on goals: Laura Mccarthy has not eaten any cookies at work since last appt.  She has determined she can't keep foods she especially likes b/c she'll eat the whole pkg.  Another problem she has identified recently is eating in the car.  She has started planning some meals, but was frustrated with how long it took her.  She did feel good about some soup she made in large enough quantity to freeze portions.    Learning Readiness: Ready  Usual eating pattern includes 3 meals (bkfst is protein drink) and 2 snacks per day. Recent physical activity still includes 30 min walking 3 X wk.    Progress Towards Goal(s):  In progress.   Nutritional Diagnosis: Slight progress on [place]-3.3 Overweight/obesity As related to energy expenditure.  As evidenced by better mindfulness around food choices, including not eating cookies at work.    Intervention:  Nutrition education  Handouts given during visit include:  After-Visit Summary (AVS)  Meal Planning form (again)  Demonstrated degree of understanding via:  Teach Back  Barriers to learning/adherence to lifestyle change: Cravings for sweets; stress eating.   Monitoring/Evaluation:  Dietary intake, exercise, and body weight in 4 week(s).

## 2018-07-28 NOTE — Patient Instructions (Signed)
-   A good rule of thumb: SEE all you eat.  Always plate your food before eating whether for a snack or meal.  - Re-take the quiz at Self-compassion.org, and write down [facility] of your scores (an overall and sub-scores).    - Complete the Meal Planning Form, including the lists of protein, starch, and veg foods.   - Discuss with your husband what leftovers are intended for.    - In the car: Limit snacks to individual portions.  Portion out a number of snacks at the beginning of the week, or plan to bring fruit.   - To help cut down on excessive snacking in the car, establish a list of topics you'd like to think about, people you would like to call, or music you want to listen to.  - The sense of needing food to be available at all times:   - "What is happening right now?"  Ask yourself what led you to this point or to this feeling?   - Approach this question with curiosity and not judgment.     - This might also include an assessment of how you could end your work day feeling better?  - Remind yourself:  I am going home for dinner, which will be within the next 90 minutes.    - Choose a favorite topic to review in your head.    - Maintain your walking 30 minutes 3 X wk.

## 2018-07-30 MED FILL — BRIMONIDINE 0.2% EYE DROP: 0.2 | 75 days supply | Qty: 15 | Fill #0

## 2018-08-25 ENCOUNTER — Ambulatory Visit: Payer: 59 | Admitting: Family Medicine

## 2018-08-25 DIAGNOSIS — H40023 Open angle with borderline findings, high risk, bilateral: Secondary | ICD-10-CM | POA: Diagnosis not present

## 2018-08-25 DIAGNOSIS — H04123 Dry eye syndrome of bilateral lacrimal glands: Secondary | ICD-10-CM | POA: Diagnosis not present

## 2018-08-25 DIAGNOSIS — H40053 Ocular hypertension, bilateral: Secondary | ICD-10-CM | POA: Diagnosis not present

## 2018-09-15 ENCOUNTER — Ambulatory Visit: Payer: 59 | Admitting: Family Medicine

## 2018-10-09 ENCOUNTER — Telehealth: Payer: Self-pay

## 2018-10-09 ENCOUNTER — Encounter: Payer: Self-pay | Admitting: Family Medicine

## 2018-10-09 ENCOUNTER — Ambulatory Visit: Payer: 59 | Admitting: Family Medicine

## 2018-10-09 VITALS — BP 130/92 | HR 100 | Temp 98.7°F | Resp 18 | Ht 64.0 in | Wt 213.0 lb

## 2018-10-09 DIAGNOSIS — J40 Bronchitis, not specified as acute or chronic: Secondary | ICD-10-CM | POA: Diagnosis not present

## 2018-10-09 DIAGNOSIS — J4520 Mild intermittent asthma, uncomplicated: Secondary | ICD-10-CM

## 2018-10-09 MED ORDER — ALBUTEROL SULFATE HFA 108 (90 BASE) MCG/ACT IN AERS: 2.0000 | INHALATION_SPRAY | Freq: Four times a day (QID) | RESPIRATORY_TRACT | 0 refills | Status: DC | PRN

## 2018-10-09 MED ORDER — GUAIFENESIN-CODEINE 100-10 MG/5ML PO SOLN: 5.0000 mL | Freq: Three times a day (TID) | ORAL | 0 refills | Status: DC | PRN

## 2018-10-09 MED ORDER — ALBUTEROL SULFATE 108 (90 BASE) MCG/ACT IN AEPB: 1.0000 | INHALATION_SPRAY | Freq: Four times a day (QID) | RESPIRATORY_TRACT | 1 refills | Status: DC | PRN

## 2018-10-09 NOTE — Telephone Encounter (Signed)
Ok to switch to low cost alternative

## 2018-10-09 NOTE — Addendum Note (Signed)
Addended by: Lesleigh Noe on: 10/09/2018 04:47 PM   Modules accepted: Orders

## 2018-10-09 NOTE — Progress Notes (Signed)
Subjective:     Laura Mccarthy is a 62 y.o. female presenting for Cough (x 1 week. Headache, chest congestion. Not sure about fever. Has used nebulizer treatment x 2, tried OTC night time cold medicine and Delsym.)     Cough  This is a new problem. The current episode started in the past 7 days. The problem has been gradually worsening. The problem occurs constantly. The cough is non-productive. Associated symptoms include headaches, hemoptysis, nasal congestion, postnasal drip, rhinorrhea, a sore throat and shortness of breath. Pertinent negatives include no chest pain, chills, ear congestion, ear pain, fever, myalgias, sweats or wheezing. Associated symptoms comments: Stomach pain from coughing . The symptoms are aggravated by lying down. She has tried steroid inhaler and OTC cough suppressant for the symptoms. The treatment provided mild relief. Her past medical history is significant for asthma, bronchitis and pneumonia.     No allergy history  Endorses some sweating Doing Ok with hydration    Review of Systems  Constitutional: Negative for chills and fever.  HENT: Positive for postnasal drip, rhinorrhea and sore throat. Negative for ear pain.   Respiratory: Positive for cough, hemoptysis and shortness of breath. Negative for wheezing.   Cardiovascular: Negative for chest pain.  Musculoskeletal: Negative for myalgias.  Neurological: Positive for headaches.     Social History   Tobacco Use  Smoking Status Never Smoker  Smokeless Tobacco Never Used        Objective:    BP Readings from Last 3 Encounters:  10/09/18 (!) 130/92  05/13/18 138/80  08/27/16 136/90   Wt Readings from Last 3 Encounters:  10/09/18 213 lb 0.4 oz (96.6 kg)  07/28/18 209 lb 6.4 oz (95 kg)  07/07/18 209 lb 9.6 oz (95.1 kg)  BP (!) 130/92   Pulse 100   Temp 98.7 F (37.1 C)   Resp 18   Ht 5\' 4"  (1.626 m)   Wt 213 lb 0.4 oz (96.6 kg)   SpO2 99%   BMI 36.57 kg/m  Physical Exam    Constitutional: She appears well-developed and well-nourished.  HENT:  Head: Normocephalic and atraumatic.  Right Ear: Tympanic membrane and ear canal normal.  Left Ear: Tympanic membrane and ear canal normal.  Nose: Mucosal edema and rhinorrhea present. Right sinus exhibits no maxillary sinus tenderness and no frontal sinus tenderness. Left sinus exhibits no maxillary sinus tenderness and no frontal sinus tenderness.  Mouth/Throat: Uvula is midline and mucous membranes are normal. Posterior oropharyngeal erythema present. No oropharyngeal exudate or posterior oropharyngeal edema. Tonsils are 0 on the right. Tonsils are 0 on the left.  Neck: Neck supple.  Cardiovascular: Normal rate and regular rhythm.  No murmur heard. Pulmonary/Chest: Effort normal. No respiratory distress. She has wheezes (scattered). She has rhonchi (diffuse). She has no rales.  Lymphadenopathy:    She has no cervical adenopathy.  Neurological: She is alert.  Skin: Skin is warm. Capillary refill takes less than 2 seconds. She is diaphoretic.  Psychiatric: She has a normal mood and affect.          Assessment & Plan:   Problem List Items Addressed This Visit      Respiratory   Asthma   Relevant Medications   Albuterol Sulfate (PROAIR RESPICLICK) 885 (90 Base) MCG/ACT AEPB   guaiFENesin-codeine 100-10 MG/5ML syrup    Other Visit Diagnoses    Bronchitis    -  Primary   Relevant Medications   Albuterol Sulfate (PROAIR RESPICLICK) 027 (90 Base) MCG/ACT  AEPB   guaiFENesin-codeine 100-10 MG/5ML syrup     No fever or chills and diffuse rhonchi on exam without signs of clear consolidation  Patient Instructions  You have a viral infection that is worsening your asthma  - Albuterol inhaler -- do this 3-4 times per day for next 1-2 days, then decrease to 2 times then 1 time daily, then as needed  - Take cough medication at night as needed  - Honey is very helpful for cough -- use with tea this will help a  lot  - if fevers/chills, no improvement in next 5 days return to clinic    Return for to establish with new PCP and review BP.  Lesleigh Noe, MD

## 2018-10-09 NOTE — Telephone Encounter (Signed)
Received a fax from [facility] stating that Proair Respiclick inhaler is going to cost $50 for the patient and asking for alternative. I called pharmacy and they said if we send in Albuterol HFA then pharmacy will fill this with which ever inhaler is going to be covered more with patient's insurance. Please review. Thank you

## 2018-10-09 NOTE — Telephone Encounter (Signed)
Left detailed message on patient's phone with information as below.

## 2018-10-09 NOTE — Patient Instructions (Signed)
You have a viral infection that is worsening your asthma  - Albuterol inhaler -- do this 3-4 times per day for next 1-2 days, then decrease to 2 times then 1 time daily, then as needed  - Take cough medication at night as needed  - Honey is very helpful for cough -- use with tea this will help a lot  - if fevers/chills, no improvement in next 5 days return to clinic

## 2018-10-13 ENCOUNTER — Ambulatory Visit: Payer: 59 | Admitting: Family Medicine

## 2018-10-15 ENCOUNTER — Ambulatory Visit (INDEPENDENT_AMBULATORY_CARE_PROVIDER_SITE_OTHER): Payer: Self-pay | Admitting: Nurse Practitioner

## 2018-10-15 VITALS — BP 145/80 | HR 106 | Temp 98.4°F | Resp 20 | Wt 193.8 lb

## 2018-10-15 DIAGNOSIS — J209 Acute bronchitis, unspecified: Secondary | ICD-10-CM

## 2018-10-15 MED ORDER — METHYLPREDNISOLONE 4 MG PO TBPK: ORAL_TABLET | ORAL | 0 refills | Status: AC

## 2018-10-15 MED ORDER — BENZONATATE 200 MG PO CAPS: 200.0000 mg | ORAL_CAPSULE | Freq: Three times a day (TID) | ORAL | 0 refills | Status: AC | PRN

## 2018-10-15 MED ORDER — DOXYCYCLINE HYCLATE 100 MG PO TABS: 100.0000 mg | ORAL_TABLET | Freq: Two times a day (BID) | ORAL | 0 refills | Status: AC

## 2018-10-15 MED FILL — BENZONATATE 200 MG CAPS: 200 | 10 days supply | Qty: 30 | Fill #0

## 2018-10-15 MED FILL — METHYLPREDNISOLONE 4 MG TAB: 4 | 6 days supply | Qty: 21 | Fill #0

## 2018-10-15 MED FILL — DOXYCYCLINE HYCLATE 100 MG: 100 | 10 days supply | Qty: 20 | Fill #0

## 2018-10-15 NOTE — Progress Notes (Signed)
Subjective:     Laura Mccarthy is a 62 y.o. female here for evaluation of a cough. Onset of symptoms was 2 weeks ago. Symptoms have been rapidly worsening since that time. The cough is dry and harsh and is aggravated by nothing. Associated symptoms include: change in voice, fever, shortness of breath and wheezing. Patient does have a history of asthma. Patient does not have a history of environmental allergens. Patient has not traveled recently. Patient does not have a history of smoking.  The patient was seen at her primary care office 1 week ago and given an albuterol inhaler and cough medicine.  Patient states since that time her symptoms have worsened.  Patient states she works in a [employer] and they did give her an injection of Decadron 1 day ago.  The patient states she has been using her inhaler at least 3 times a day since it was prescribed.  The patient also informs that she has been using her nebulizer on a daily basis since she was seen last week.    The following portions of the patient's history were reviewed and updated as appropriate: allergies, current medications and past medical history.  Review of Systems Constitutional: positive for anorexia, fatigue and fevers, negative for chills, malaise and sweats Eyes: negative Ears, nose, mouth, throat, and face: positive for hoarseness, nasal congestion and sore throat, negative for ear drainage and earaches Respiratory: positive for asthma, cough, dyspnea on exertion and wheezing, negative for chronic bronchitis, pleurisy/chest pain, pneumonia and sputum Cardiovascular: negative Gastrointestinal: positive for decreased appetite, negative for abdominal pain, constipation, diarrhea, nausea and vomiting Neurological: positive for headaches, negative for coordination problems, dizziness, gait problems, paresthesia and weakness    Objective:    BP (!) 145/80 (BP Location: Right Arm, Patient Position: Sitting, Cuff Size: Large)   Pulse  (!) 106   Temp 98.4 F (36.9 C) (Oral)   Resp 20   Wt 193 lb 12.8 oz (87.9 kg)   SpO2 98%   BMI 33.27 kg/m  General appearance: alert, cooperative, fatigued, mild distress and due to excessive coughing, diaphoretic Head: Normocephalic, without obvious abnormality, atraumatic Eyes: conjunctivae/corneas clear. PERRL, EOM's intact. Fundi benign. Ears: normal TM's and external ear canals both ears Nose: Nares normal. Septum midline. Mucosa normal. No drainage or sinus tenderness. Throat: lips, mucosa, and tongue normal; teeth and gums normal Lungs: rhonchi LLL, LUL, RLL and RUL and wheezes LUL and RUL Heart: regular rate and rhythm, S1, S2 normal, no murmur, click, rub or gallop Abdomen: soft, non-tender; bowel sounds normal; no masses,  no organomegaly Pulses: 2+ and symmetric Skin: Skin color, texture, turgor normal. No rashes or lesions Lymph nodes: cervical and submandibular nodes normal Neurologic: Grossly normal    Assessment:    Acute Bronchitis    Plan:   Exam findings, diagnosis etiology and medication use and indications reviewed with patient. Follow- Up and discharge instructions provided. No emergent/urgent issues found on exam.  Discussed with patient that due to worsening of symptoms to include worsening shortness of breath, and fever, that was most likely necessary to start her on antibiotic at this time.  Patient refused nebulizer treatment while in the office stating she could do one at home.  Felt this was appropriate as patient had more rhonchi than wheezing, and sats were at 98% in our office.  Patient also have requested a stronger cough syrup, but informed that I would like to try the cough Perles to see if they work.  If  symptoms or cough do not improve, informed her that I would include in my note that a stronger cough syrup would be necessary at that time.  Patient education was provided. Patient verbalized understanding of information provided and agrees with plan of  care (POC), all questions answered. The patient is advised to call or return to clinic if condition does not see an improvement in symptoms, or to seek the care of the closest emergency department if condition worsens with the above plan.   1. Acute bronchitis, unspecified organism  - doxycycline (VIBRA-TABS) 100 MG tablet; Take 1 tablet (100 mg total) by mouth 2 (two) times daily for 10 days.  Dispense: 20 tablet; Refill: 0 - benzonatate (TESSALON) 200 MG capsule; Take 1 capsule (200 mg total) by mouth 3 (three) times daily as needed for up to 10 days for cough.  Dispense: 30 capsule; Refill: 0 - methylPREDNISolone (MEDROL DOSEPAK) 4 MG TBPK tablet; Take as directed.  Dispense: 21 tablet; Refill: 0 -Take medication as prescribed. -Ibuprofen or Tylenol for pain, fever, or general discomfort. -Increase fluids. Keep a bottle of water with you to help with coughing episodes. -Sleep elevated on at least 2 pillows at bedtime to help with cough. -Use a humidifier or vaporizer when at home and during sleep. -May use a teaspoon of honey or over-the-counter cough drops to help with cough. -Follow up in the emergency department if you develop worsening shortness of breath, worsening difficulty breathing, or other concerns.  Symptoms worsen or do not improve, you may need further evaluation to include a chest x-ray.

## 2018-10-15 NOTE — Patient Instructions (Signed)
Acute Bronchitis, Adult -Take medication as prescribed. -Ibuprofen or Tylenol for pain, fever, or general discomfort. -Increase fluids. Keep a bottle of water with you to help with coughing episodes. -Sleep elevated on at least 2 pillows at bedtime to help with cough. -Use a humidifier or vaporizer when at home and during sleep. -May use a teaspoon of honey or over-the-counter cough drops to help with cough. -Follow up in the emergency department if you develop worsening shortness of breath, worsening difficulty breathing, or other concerns.  Symptoms worsen or do not improve, you may need further evaluation to include a chest x-ray.  Acute bronchitis is sudden (acute) swelling of the air tubes (bronchi) in the lungs. Acute bronchitis causes these tubes to fill with mucus, which can make it hard to breathe. It can also cause coughing or wheezing. In adults, acute bronchitis usually goes away within 2 weeks. A cough caused by bronchitis may last up to 3 weeks. Smoking, allergies, and asthma can make the condition worse. Repeated episodes of bronchitis may cause further lung problems, such as chronic obstructive pulmonary disease (COPD). What are the causes? This condition can be caused by germs and by substances that irritate the lungs, including:  Cold and flu viruses. This condition is most often caused by the same virus that causes a cold.  Bacteria.  Exposure to tobacco smoke, dust, fumes, and air pollution.  What increases the risk? This condition is more likely to develop in people who:  Have close contact with someone with acute bronchitis.  Are exposed to lung irritants, such as tobacco smoke, dust, fumes, and vapors.  Have a weak immune system.  Have a respiratory condition such as asthma.  What are the signs or symptoms? Symptoms of this condition include:  A cough.  Coughing up clear, yellow, or green mucus.  Wheezing.  Chest congestion.  Shortness of breath.  A  fever.  Body aches.  Chills.  A sore throat.  How is this diagnosed? This condition is usually diagnosed with a physical exam. During the exam, your health care provider may order tests, such as chest X-rays, to rule out other conditions. He or she may also:  Test a sample of your mucus for bacterial infection.  Check the level of oxygen in your blood. This is done to check for pneumonia.  Do a chest X-ray or lung function testing to rule out pneumonia and other conditions.  Perform blood tests.  Your health care provider will also ask about your symptoms and medical history. How is this treated? Most cases of acute bronchitis clear up over time without treatment. Your health care provider may recommend:  Drinking more fluids. Drinking more makes your mucus thinner, which may make it easier to breathe.  Taking a medicine for a fever or cough.  Taking an antibiotic medicine.  Using an inhaler to help improve shortness of breath and to control a cough.  Using a cool mist vaporizer or humidifier to make it easier to breathe.  Follow these instructions at home: Medicines  Take over-the-counter and prescription medicines only as told by your health care provider.  If you were prescribed an antibiotic, take it as told by your health care provider. Do not stop taking the antibiotic even if you start to feel better. General instructions  Get plenty of rest.  Drink enough fluids to keep your urine clear or pale yellow.  Avoid smoking and secondhand smoke. Exposure to cigarette smoke or irritating chemicals will make bronchitis worse.  If you smoke and you need help quitting, ask your health care provider. Quitting smoking will help your lungs heal faster.  Use an inhaler, cool mist vaporizer, or humidifier as told by your health care provider.  Keep all follow-up visits as told by your health care provider. This is important. How is this prevented? To lower your risk of  getting this condition again:  Wash your hands often with soap and water. If soap and water are not available, use hand sanitizer.  Avoid contact with people who have cold symptoms.  Try not to touch your hands to your mouth, nose, or eyes.  Make sure to get the flu shot every year.  Contact a health care provider if:  Your symptoms do not improve in 2 weeks of treatment. Get help right away if:  You cough up blood.  You have chest pain.  You have severe shortness of breath.  You become dehydrated.  You faint or keep feeling like you are going to faint.  You keep vomiting.  You have a severe headache.  Your fever or chills gets worse. This information is not intended to replace advice given to you by your health care provider. Make sure you discuss any questions you have with your health care provider. Document Released: 12/19/2004 Document Revised: 06/05/2016 Document Reviewed: 05/01/2016 Elsevier Interactive Patient Education  [employer].

## 2018-10-26 MED FILL — buPROPion HCL ER (XL) 150 M: 150 | 90 days supply | Qty: 270 | Fill #0

## 2018-10-26 MED FILL — DULOXETINE HCL 60 MG CPEP: 60 | 90 days supply | Qty: 90 | Fill #0

## 2018-11-03 ENCOUNTER — Telehealth: Payer: Self-pay

## 2018-11-03 DIAGNOSIS — J4520 Mild intermittent asthma, uncomplicated: Secondary | ICD-10-CM

## 2018-11-03 DIAGNOSIS — J40 Bronchitis, not specified as acute or chronic: Secondary | ICD-10-CM

## 2018-11-03 NOTE — Telephone Encounter (Signed)
Left message for patient to call back. Received a fax from [facility] requesting refill on Albuterol inhaler. Patient was seen by Dr Einar Pheasant one time and need to know if she is wanting to be established with Dr Einar Pheasant as PCP before refilling. If so need to have a follow up appointment or physical for the future. No future appointments scheduled at this time.

## 2018-11-05 NOTE — Telephone Encounter (Signed)
Spoke with patient today. Patient would like to get refills on Albuterol inhaler just in case she has to use it. Patient will stay with [facility] for her care, did not specify if it will be Dr Einar Pheasant, patient will call back and schedule future appointment. Patient was advised in order for [place] to continue filling her inhaler she would need to be seen. Patient is going out of town and will call at a later time.   Also patient said that she had a good visit last time she saw Dr Einar Pheasant for been sick on 10/09/2018. 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 Patient states she called here on 10/15/18 and spoke with a nurse and was told that she would get a response in 72 hours, which she felt like was a long time but ok. She states she never heard back from the doctor and ended up going to [facility]gent [facility] through [facility] that day 10/15/18 and was taking out of work and was treated for Bronchitis (per epic notes). Patient was disappointed that this happened and this experience. I apologized to the patient several times and advised her that I would let my manager know and speak with Dr Einar Pheasant. Advised patient unfortunately I do not see any documentation of this conversation and that I am truly sorry this happened to her. Advised patient she would be getting a follow up call on this. And also pulling inhaler down for refills.

## 2018-11-10 MED ORDER — ALBUTEROL SULFATE HFA 108 (90 BASE) MCG/ACT IN AERS: 2.0000 | INHALATION_SPRAY | Freq: Four times a day (QID) | RESPIRATORY_TRACT | 1 refills | Status: DC | PRN

## 2018-11-10 NOTE — Telephone Encounter (Signed)
Medication refilled

## 2018-11-30 MED FILL — BRIMONIDINE 0.2% EYE DROP: 0.2 | 75 days supply | Qty: 15 | Fill #0

## 2018-12-08 MED FILL — D-AMPHETAMINE ER 15 MG CAP: 15 | 90 days supply | Qty: 360 | Fill #0

## 2018-12-29 DIAGNOSIS — H40053 Ocular hypertension, bilateral: Secondary | ICD-10-CM | POA: Diagnosis not present

## 2018-12-29 DIAGNOSIS — H04123 Dry eye syndrome of bilateral lacrimal glands: Secondary | ICD-10-CM | POA: Diagnosis not present

## 2018-12-29 DIAGNOSIS — H40023 Open angle with borderline findings, high risk, bilateral: Secondary | ICD-10-CM | POA: Diagnosis not present

## 2018-12-29 DIAGNOSIS — H2513 Age-related nuclear cataract, bilateral: Secondary | ICD-10-CM | POA: Diagnosis not present

## 2019-01-29 ENCOUNTER — Other Ambulatory Visit: Payer: Self-pay | Admitting: Family Medicine

## 2019-01-29 DIAGNOSIS — Z1231 Encounter for screening mammogram for malignant neoplasm of breast: Secondary | ICD-10-CM

## 2019-02-09 MED FILL — DULOXETINE HCL 60 MG CPEP: 60 | 90 days supply | Qty: 90 | Fill #0

## 2019-02-09 MED FILL — buPROPion HCL ER (XL) 150 M: 150 | 90 days supply | Qty: 270 | Fill #0

## 2019-03-02 ENCOUNTER — Ambulatory Visit: Payer: 59

## 2019-03-02 DIAGNOSIS — M791 Myalgia, unspecified site: Secondary | ICD-10-CM | POA: Diagnosis not present

## 2019-03-02 DIAGNOSIS — M9901 Segmental and somatic dysfunction of cervical region: Secondary | ICD-10-CM | POA: Diagnosis not present

## 2019-03-02 DIAGNOSIS — R51 Headache: Secondary | ICD-10-CM | POA: Diagnosis not present

## 2019-03-02 DIAGNOSIS — M5414 Radiculopathy, thoracic region: Secondary | ICD-10-CM | POA: Diagnosis not present

## 2019-03-02 DIAGNOSIS — M9902 Segmental and somatic dysfunction of thoracic region: Secondary | ICD-10-CM | POA: Diagnosis not present

## 2019-03-02 DIAGNOSIS — M531 Cervicobrachial syndrome: Secondary | ICD-10-CM | POA: Diagnosis not present

## 2019-03-03 DIAGNOSIS — M9901 Segmental and somatic dysfunction of cervical region: Secondary | ICD-10-CM | POA: Diagnosis not present

## 2019-03-03 DIAGNOSIS — M5414 Radiculopathy, thoracic region: Secondary | ICD-10-CM | POA: Diagnosis not present

## 2019-03-03 DIAGNOSIS — R51 Headache: Secondary | ICD-10-CM | POA: Diagnosis not present

## 2019-03-03 DIAGNOSIS — M791 Myalgia, unspecified site: Secondary | ICD-10-CM | POA: Diagnosis not present

## 2019-03-03 DIAGNOSIS — M531 Cervicobrachial syndrome: Secondary | ICD-10-CM | POA: Diagnosis not present

## 2019-03-03 DIAGNOSIS — M9902 Segmental and somatic dysfunction of thoracic region: Secondary | ICD-10-CM | POA: Diagnosis not present

## 2019-03-04 DIAGNOSIS — M531 Cervicobrachial syndrome: Secondary | ICD-10-CM | POA: Diagnosis not present

## 2019-03-04 DIAGNOSIS — M791 Myalgia, unspecified site: Secondary | ICD-10-CM | POA: Diagnosis not present

## 2019-03-04 DIAGNOSIS — R51 Headache: Secondary | ICD-10-CM | POA: Diagnosis not present

## 2019-03-04 DIAGNOSIS — M9901 Segmental and somatic dysfunction of cervical region: Secondary | ICD-10-CM | POA: Diagnosis not present

## 2019-03-04 DIAGNOSIS — M9902 Segmental and somatic dysfunction of thoracic region: Secondary | ICD-10-CM | POA: Diagnosis not present

## 2019-03-04 DIAGNOSIS — M5414 Radiculopathy, thoracic region: Secondary | ICD-10-CM | POA: Diagnosis not present

## 2019-03-09 DIAGNOSIS — M531 Cervicobrachial syndrome: Secondary | ICD-10-CM | POA: Diagnosis not present

## 2019-03-09 DIAGNOSIS — R51 Headache: Secondary | ICD-10-CM | POA: Diagnosis not present

## 2019-03-09 DIAGNOSIS — M5414 Radiculopathy, thoracic region: Secondary | ICD-10-CM | POA: Diagnosis not present

## 2019-03-09 DIAGNOSIS — M9901 Segmental and somatic dysfunction of cervical region: Secondary | ICD-10-CM | POA: Diagnosis not present

## 2019-03-09 DIAGNOSIS — M9902 Segmental and somatic dysfunction of thoracic region: Secondary | ICD-10-CM | POA: Diagnosis not present

## 2019-03-09 DIAGNOSIS — M791 Myalgia, unspecified site: Secondary | ICD-10-CM | POA: Diagnosis not present

## 2019-03-12 DIAGNOSIS — M5414 Radiculopathy, thoracic region: Secondary | ICD-10-CM | POA: Diagnosis not present

## 2019-03-12 DIAGNOSIS — R51 Headache: Secondary | ICD-10-CM | POA: Diagnosis not present

## 2019-03-12 DIAGNOSIS — M791 Myalgia, unspecified site: Secondary | ICD-10-CM | POA: Diagnosis not present

## 2019-03-12 DIAGNOSIS — M9901 Segmental and somatic dysfunction of cervical region: Secondary | ICD-10-CM | POA: Diagnosis not present

## 2019-03-12 DIAGNOSIS — M9902 Segmental and somatic dysfunction of thoracic region: Secondary | ICD-10-CM | POA: Diagnosis not present

## 2019-03-12 DIAGNOSIS — M531 Cervicobrachial syndrome: Secondary | ICD-10-CM | POA: Diagnosis not present

## 2019-03-23 DIAGNOSIS — M5414 Radiculopathy, thoracic region: Secondary | ICD-10-CM | POA: Diagnosis not present

## 2019-03-23 DIAGNOSIS — M9901 Segmental and somatic dysfunction of cervical region: Secondary | ICD-10-CM | POA: Diagnosis not present

## 2019-03-23 DIAGNOSIS — M791 Myalgia, unspecified site: Secondary | ICD-10-CM | POA: Diagnosis not present

## 2019-03-23 DIAGNOSIS — M9902 Segmental and somatic dysfunction of thoracic region: Secondary | ICD-10-CM | POA: Diagnosis not present

## 2019-03-23 DIAGNOSIS — M531 Cervicobrachial syndrome: Secondary | ICD-10-CM | POA: Diagnosis not present

## 2019-03-23 DIAGNOSIS — R51 Headache: Secondary | ICD-10-CM | POA: Diagnosis not present

## 2019-03-29 MED FILL — BRIMONIDINE 0.2% EYE DROP: 0.2 | 75 days supply | Qty: 15 | Fill #0

## 2019-03-30 DIAGNOSIS — M9902 Segmental and somatic dysfunction of thoracic region: Secondary | ICD-10-CM | POA: Diagnosis not present

## 2019-03-30 DIAGNOSIS — M9901 Segmental and somatic dysfunction of cervical region: Secondary | ICD-10-CM | POA: Diagnosis not present

## 2019-03-30 DIAGNOSIS — M791 Myalgia, unspecified site: Secondary | ICD-10-CM | POA: Diagnosis not present

## 2019-03-30 DIAGNOSIS — M531 Cervicobrachial syndrome: Secondary | ICD-10-CM | POA: Diagnosis not present

## 2019-03-30 DIAGNOSIS — R51 Headache: Secondary | ICD-10-CM | POA: Diagnosis not present

## 2019-03-30 DIAGNOSIS — M5414 Radiculopathy, thoracic region: Secondary | ICD-10-CM | POA: Diagnosis not present

## 2019-04-07 MED FILL — BRIMONIDINE 0.2% EYE DROP: 0.2 | 75 days supply | Qty: 15 | Fill #0

## 2019-04-22 MED FILL — D-AMPHETAMINE ER 15 MG CAP: 15 | 90 days supply | Qty: 360 | Fill #0

## 2019-04-27 ENCOUNTER — Ambulatory Visit: Payer: 59

## 2019-05-12 DIAGNOSIS — M5414 Radiculopathy, thoracic region: Secondary | ICD-10-CM | POA: Diagnosis not present

## 2019-05-12 DIAGNOSIS — M531 Cervicobrachial syndrome: Secondary | ICD-10-CM | POA: Diagnosis not present

## 2019-05-12 DIAGNOSIS — M9902 Segmental and somatic dysfunction of thoracic region: Secondary | ICD-10-CM | POA: Diagnosis not present

## 2019-05-12 DIAGNOSIS — M791 Myalgia, unspecified site: Secondary | ICD-10-CM | POA: Diagnosis not present

## 2019-05-12 DIAGNOSIS — R51 Headache: Secondary | ICD-10-CM | POA: Diagnosis not present

## 2019-05-12 DIAGNOSIS — M9901 Segmental and somatic dysfunction of cervical region: Secondary | ICD-10-CM | POA: Diagnosis not present

## 2019-05-20 DIAGNOSIS — M9901 Segmental and somatic dysfunction of cervical region: Secondary | ICD-10-CM | POA: Diagnosis not present

## 2019-05-20 DIAGNOSIS — M5414 Radiculopathy, thoracic region: Secondary | ICD-10-CM | POA: Diagnosis not present

## 2019-05-20 DIAGNOSIS — M9902 Segmental and somatic dysfunction of thoracic region: Secondary | ICD-10-CM | POA: Diagnosis not present

## 2019-05-20 DIAGNOSIS — R51 Headache: Secondary | ICD-10-CM | POA: Diagnosis not present

## 2019-05-20 DIAGNOSIS — M531 Cervicobrachial syndrome: Secondary | ICD-10-CM | POA: Diagnosis not present

## 2019-05-20 DIAGNOSIS — M791 Myalgia, unspecified site: Secondary | ICD-10-CM | POA: Diagnosis not present

## 2019-06-01 MED FILL — buPROPion HCL ER (XL) 150 M: 150 | 90 days supply | Qty: 270 | Fill #0

## 2019-06-01 MED FILL — DULOXETINE HCL 60 MG CPEP: 60 | 90 days supply | Qty: 90 | Fill #0

## 2019-06-08 ENCOUNTER — Other Ambulatory Visit: Payer: Self-pay

## 2019-06-08 ENCOUNTER — Ambulatory Visit
Admission: RE | Admit: 2019-06-08 | Discharge: 2019-06-08 | Disposition: A | Discharge status: Home / Self Care | Payer: 59 | Source: Ambulatory Visit | Attending: Family Medicine | Admitting: Family Medicine

## 2019-06-08 DIAGNOSIS — Z1231 Encounter for screening mammogram for malignant neoplasm of breast: Secondary | ICD-10-CM | POA: Diagnosis not present

## 2019-06-23 ENCOUNTER — Telehealth: Payer: Self-pay

## 2019-06-23 NOTE — Telephone Encounter (Signed)
I spoke to the patient this am and she expressed interest in establishing care with Dr. Darnell Level.  She advised that her husband Tatiyana Foucher is already Dr. Synthia Innocent patient and he speaks highly of him.  Please advise if you are willing to accept her as a new patient.  She has previously seen Dr. Deborra Medina and Dr. Einar Pheasant, but no PCP is listed on the chart at this time.

## 2019-06-23 NOTE — Telephone Encounter (Signed)
Ok to transfer care - thank you.

## 2019-07-06 DIAGNOSIS — H40023 Open angle with borderline findings, high risk, bilateral: Secondary | ICD-10-CM | POA: Diagnosis not present

## 2019-07-06 DIAGNOSIS — H52223 Regular astigmatism, bilateral: Secondary | ICD-10-CM | POA: Diagnosis not present

## 2019-07-06 DIAGNOSIS — H524 Presbyopia: Secondary | ICD-10-CM | POA: Diagnosis not present

## 2019-07-06 DIAGNOSIS — H2513 Age-related nuclear cataract, bilateral: Secondary | ICD-10-CM | POA: Diagnosis not present

## 2019-07-06 DIAGNOSIS — H40053 Ocular hypertension, bilateral: Secondary | ICD-10-CM | POA: Diagnosis not present

## 2019-07-06 DIAGNOSIS — H04123 Dry eye syndrome of bilateral lacrimal glands: Secondary | ICD-10-CM | POA: Diagnosis not present

## 2019-07-26 MED FILL — BRIMONIDINE TARTRATE 0.2 %: 0.2 | 75 days supply | Qty: 15 | Fill #1

## 2019-08-03 ENCOUNTER — Ambulatory Visit (INDEPENDENT_AMBULATORY_CARE_PROVIDER_SITE_OTHER): Payer: 59 | Admitting: Family Medicine

## 2019-08-03 ENCOUNTER — Other Ambulatory Visit: Payer: Self-pay

## 2019-08-03 ENCOUNTER — Encounter: Payer: Self-pay | Admitting: Family Medicine

## 2019-08-03 DIAGNOSIS — J4520 Mild intermittent asthma, uncomplicated: Secondary | ICD-10-CM

## 2019-08-03 DIAGNOSIS — K219 Gastro-esophageal reflux disease without esophagitis: Secondary | ICD-10-CM

## 2019-08-03 DIAGNOSIS — H409 Unspecified glaucoma: Secondary | ICD-10-CM

## 2019-08-03 DIAGNOSIS — F988 Other specified behavioral and emotional disorders with onset usually occurring in childhood and adolescence: Secondary | ICD-10-CM | POA: Diagnosis not present

## 2019-08-03 DIAGNOSIS — R030 Elevated blood-pressure reading, without diagnosis of hypertension: Secondary | ICD-10-CM | POA: Diagnosis not present

## 2019-08-03 DIAGNOSIS — Z9884 Bariatric surgery status: Secondary | ICD-10-CM

## 2019-08-03 DIAGNOSIS — G43909 Migraine, unspecified, not intractable, without status migrainosus: Secondary | ICD-10-CM | POA: Diagnosis not present

## 2019-08-03 DIAGNOSIS — F329 Major depressive disorder, single episode, unspecified: Secondary | ICD-10-CM | POA: Diagnosis not present

## 2019-08-03 DIAGNOSIS — F32A Depression, unspecified: Secondary | ICD-10-CM

## 2019-08-03 NOTE — Patient Instructions (Addendum)
For leg pain consider compression stockings Work on less sweetened beverages (or diluted)  Good to meet you today! Return in 3 months for physical, prior fasting for blood work.   Blood pressure is staying elevated - start monitoring at home, let me know if consistently >140/90. Your goal blood pressure is <140/90. Work on low salt/sodium diet - goal <1.5gm (1,500mg ) per day. Eat a diet high in fruits/vegetables and whole grains.  Look into mediterranean diet.  Goal activity is 122min/wk of moderate intensity exercise.  This can be split into 30 minute chunks.  If you are not at this level, you can start with smaller 10-15 min increments and slowly build up activity. Look at [place].org for more resources

## 2019-08-03 NOTE — Progress Notes (Signed)
This visit was conducted in person.  BP (!) 142/84 (BP Location: Right Arm, Patient Position: Sitting, Cuff Size: Large)   Pulse 100   Temp 97.8 F (36.6 C) (Tympanic)   Ht 5\' 3"  (1.6 m)   Wt 211 lb 4 oz (95.8 kg)   SpO2 97%   BMI 37.42 kg/m   Elevated on repeat (150/96)  CC: transfer of care  Subjective:    Patient ID: Laura Mccarthy, female    DOB: Dec 20, 1955, 63 y.o.   MRN: [id number]  HPI: Laura Mccarthy is a 64 y.o. female presenting on 08/03/2019 for Transitions Of Care   Transfer of care previously saw Dr Deborra Medina. Works at [employer].   Asthma tends to flare with URI - rare albuterol inhaler.   ADD and depression - sees Dr [facility] psych. On adderall 30mg  daily, wellbutrin and cymbalta. Med list updated.   Borderline glaucoma - strong fmhx. See eye doctor regularly Mardene Speak).   Obesity - h/o gastric sleeve 5 yrs ago ([facility]) - lost 78 lbs, has regained 30 lbs. Endorses stress eating. Did see nutritionist last year.   H/o migraines - managed with PRN OTC meds - last migraine was 1+ yr ago. Intermittent tension headaches.  GERD managed with tums and pepcid.   Strong fmhx CAD.   Preventative: Well woman with OBGYN Dr Talbert Nan   Lives with husband Juliene Pina: works at [employer], front office  Act: no regular exercise Diet: good water, fruits/vegetables daily      Relevant past medical, surgical, family and social history reviewed and updated as indicated. Interim medical history since our last visit reviewed. Allergies and medications reviewed and updated. Outpatient Medications Prior to Visit  Medication Sig Dispense Refill  . acetaminophen (TYLENOL) 500 MG tablet Take 1,000 mg by mouth every 6 (six) hours as needed for mild pain or moderate pain.    Marland Kitchen albuterol (PROVENTIL HFA;VENTOLIN HFA) 108 (90 Base) MCG/ACT inhaler Inhale 2 puffs into the lungs every 6 (six) hours as needed for wheezing or shortness of breath. 1 Inhaler 1  . brimonidine  (ALPHAGAN) 0.2 % ophthalmic solution   0  . DULoxetine (CYMBALTA) 60 MG capsule Take 60 mg by mouth every morning.    . Multiple Vitamins-Minerals (MULTIVITAL PO) Take by mouth.    Marland Kitchen amphetamine-dextroamphetamine (ADDERALL) 15 MG tablet Take 30 mg by mouth every morning.     Marland Kitchen buPROPion (WELLBUTRIN) 75 MG tablet Take 2 tablets (150 mg total) by mouth 3 (three) times daily. 90 tablet 0  . guaiFENesin-codeine 100-10 MG/5ML syrup Take 5 mLs by mouth 3 (three) times daily as needed for cough. 120 mL 0  . amphetamine-dextroamphetamine (ADDERALL XR) 15 MG 24 hr capsule Take 2 capsules by mouth every morning.  0  . buPROPion (WELLBUTRIN XL) 150 MG 24 hr tablet Take 3 tablets (450 mg total) by mouth daily.     No facility-administered medications prior to visit.      Per HPI unless specifically indicated in ROS section below Review of Systems Objective:    BP (!) 142/84 (BP Location: Right Arm, Patient Position: Sitting, Cuff Size: Large)   Pulse 100   Temp 97.8 F (36.6 C) (Tympanic)   Ht 5\' 3"  (1.6 m)   Wt 211 lb 4 oz (95.8 kg)   SpO2 97%   BMI 37.42 kg/m   Wt Readings from Last 3 Encounters:  08/03/19 211 lb 4 oz (95.8 kg)  10/15/18 193 lb 12.8 oz (  87.9 kg)  10/09/18 213 lb 0.4 oz (96.6 kg)    Physical Exam Vitals signs and nursing note reviewed.  Constitutional:      General: She is not in acute distress.    Appearance: Normal appearance. She is obese. She is not ill-appearing.  HENT:     Mouth/Throat:     Mouth: Mucous membranes are moist.     Pharynx: No posterior oropharyngeal erythema.  Cardiovascular:     Rate and Rhythm: Normal rate and regular rhythm.     Pulses: Normal pulses.     Heart sounds: Normal heart sounds. No murmur.  Pulmonary:     Effort: Pulmonary effort is normal. No respiratory distress.     Breath sounds: Normal breath sounds. No wheezing, rhonchi or rales.  Musculoskeletal:        General: Swelling (nonpitting BLE) present.     Right lower leg: No  edema.     Left lower leg: No edema.  Neurological:     Mental Status: She is alert.  Psychiatric:        Mood and Affect: Mood normal.        Behavior: Behavior normal.       Results for orders placed or performed in visit on 05/13/18  CBC  Result Value Ref Range   WBC 8.4 3.4 - 10.8 x10E3/uL   RBC 4.42 3.77 - 5.28 x10E6/uL   Hemoglobin 11.7 11.1 - 15.9 g/dL   Hematocrit 38.7 34.0 - 46.6 %   MCV 88 79 - 97 fL   MCH 26.5 (L) 26.6 - 33.0 pg   MCHC 30.2 (L) 31.5 - 35.7 g/dL   RDW 15.3 12.3 - 15.4 %   Platelets 360 150 - 450 x10E3/uL  Comprehensive metabolic panel  Result Value Ref Range   Glucose 95 65 - 99 mg/dL   BUN 21 8 - 27 mg/dL   Creatinine, Ser 0.80 0.57 - 1.00 mg/dL   GFR calc non Af Amer 79 >59 mL/min/1.73   GFR calc Af Amer 91 >59 mL/min/1.73   BUN/Creatinine Ratio 26 12 - 28   Sodium 143 134 - 144 mmol/L   Potassium 3.8 3.5 - 5.2 mmol/L   Chloride 103 96 - 106 mmol/L   CO2 23 20 - 29 mmol/L   Calcium 9.1 8.7 - 10.3 mg/dL   Total Protein 6.7 6.0 - 8.5 g/dL   Albumin 4.2 3.6 - 4.8 g/dL   Globulin, Total 2.5 1.5 - 4.5 g/dL   Albumin/Globulin Ratio 1.7 1.2 - 2.2   Bilirubin Total 0.3 0.0 - 1.2 mg/dL   Alkaline Phosphatase 80 39 - 117 IU/L   AST 20 0 - 40 IU/L   ALT 21 0 - 32 IU/L  Hemoglobin A1c  Result Value Ref Range   Hgb A1c MFr Bld 5.4 4.8 - 5.6 %   Est. average glucose Bld gHb Est-mCnc 108 mg/dL  Lipid panel  Result Value Ref Range   Cholesterol, Total 169 100 - 199 mg/dL   Triglycerides 76 0 - 149 mg/dL   HDL 56 >39 mg/dL   VLDL Cholesterol Cal 15 5 - 40 mg/dL   LDL Calculated 98 0 - 99 mg/dL   Chol/HDL Ratio 3.0 0.0 - 4.4 ratio  TSH  Result Value Ref Range   TSH 4.270 0.450 - 4.500 uIU/mL  Iron  Result Value Ref Range   Iron 42 27 - 139 ug/dL  B12 and Folate Panel  Result Value Ref Range   Vitamin B-12 528  232 - 1,245 pg/mL   Folate 13.8 >3.0 ng/mL  VITAMIN D 25 Hydroxy (Vit-D Deficiency, Fractures)  Result Value Ref Range   Vit D,  25-Hydroxy 27.7 (L) 30.0 - 100.0 ng/mL  Vitamin B1  Result Value Ref Range   Thiamine 151.2 66.5 - 200.0 nmol/L  Cytology - PAP  Result Value Ref Range   Adequacy      Satisfactory for evaluation  endocervical/transformation zone component PRESENT.   Diagnosis      NEGATIVE FOR INTRAEPITHELIAL LESIONS OR MALIGNANCY.   HPV NOT DETECTED    Material Submitted CervicoVaginal Pap [ThinPrep Imaged]    CYTOLOGY - PAP PAP RESULT    Assessment & Plan:   Problem List Items Addressed This Visit    Severe obesity (BMI 35.0-39.9) with comorbidity ([place]) - Primary    S/p sleeve gastrectomy 2015 (Dr Redmond Pulling), lost 78 lbs total but now regaining weight. Discussed importance of incremental diet changes she can build on - advised focus on decreased sweetened beverages work on stress eating. Reviewed starting exercise routine - recommended AHA goals of 155min mod intensity aerobic exercise given fmhx CAD. RTC 3 mo CPE and recheck weight.       Relevant Medications   amphetamine-dextroamphetamine (ADDERALL XR) 15 MG 24 hr capsule   S/P laparoscopic sleeve gastrectomy    Update labs when she returns for CPE      Migraine   Relevant Medications   buPROPion (WELLBUTRIN XL) 150 MG 24 hr tablet   Glaucoma    Managed by ophtho.       GERD (gastroesophageal reflux disease)    Managed with OTC (tums, pepcid)      Elevated blood pressure reading in office without diagnosis of hypertension    New. Advised start monitoring at home, let me know if consistently elevated. Reviewed low salt diet, regular aerobic exercise, reviewed optimal strategy to check bp. Discussed possible need to start BP med if remaining elevated. Reassess at 3 mo CPE.       Depression    Stable period on current regimen followed by psych [facility])      Relevant Medications   buPROPion (WELLBUTRIN XL) 150 MG 24 hr tablet   Asthma    Stable period on PRN albuterol. Predominant symptoms when she gets URIs      ADD (attention  deficit disorder)    Sees psychiatry, on adderall.           No orders of the defined types were placed in this encounter.  No orders of the defined types were placed in this encounter.  Patient Instructions  For leg pain consider compression stockings Work on less sweetened beverages (or diluted)  Good to meet you today! Return in 3 months for physical, prior fasting for blood work.   Blood pressure is staying elevated - start monitoring at home, let me know if consistently >140/90. Your goal blood pressure is <140/90. Work on low salt/sodium diet - goal <1.5gm (1,500mg ) per day. Eat a diet high in fruits/vegetables and whole grains.  Look into mediterranean diet.  Goal activity is 166min/wk of moderate intensity exercise.  This can be split into 30 minute chunks.  If you are not at this level, you can start with smaller 10-15 min increments and slowly build up activity. Look at [place].org for more resources     Follow up plan: Return in about 3 months (around 11/02/2019), or if symptoms worsen or fail to improve, for annual exam, prior fasting for blood work.  Ria Bush, MD

## 2019-08-04 ENCOUNTER — Encounter: Payer: Self-pay | Admitting: Family Medicine

## 2019-08-04 DIAGNOSIS — H409 Unspecified glaucoma: Secondary | ICD-10-CM | POA: Insufficient documentation

## 2019-08-04 DIAGNOSIS — F988 Other specified behavioral and emotional disorders with onset usually occurring in childhood and adolescence: Secondary | ICD-10-CM | POA: Insufficient documentation

## 2019-08-04 DIAGNOSIS — G43909 Migraine, unspecified, not intractable, without status migrainosus: Secondary | ICD-10-CM | POA: Insufficient documentation

## 2019-08-04 DIAGNOSIS — R030 Elevated blood-pressure reading, without diagnosis of hypertension: Secondary | ICD-10-CM | POA: Insufficient documentation

## 2019-08-04 DIAGNOSIS — I10 Essential (primary) hypertension: Secondary | ICD-10-CM | POA: Insufficient documentation

## 2019-08-04 NOTE — Assessment & Plan Note (Signed)
Managed with OTC (tums, pepcid)

## 2019-08-04 NOTE — Assessment & Plan Note (Signed)
Update labs when she returns for CPE

## 2019-08-04 NOTE — Assessment & Plan Note (Signed)
Stable period on PRN albuterol. Predominant symptoms when she gets URIs

## 2019-08-04 NOTE — Assessment & Plan Note (Signed)
Sees psychiatry, on adderall.

## 2019-08-04 NOTE — Assessment & Plan Note (Signed)
Stable period on current regimen followed by psych [facility])

## 2019-08-04 NOTE — Assessment & Plan Note (Signed)
S/p sleeve gastrectomy 2015 (Dr Redmond Pulling), lost 78 lbs total but now regaining weight. Discussed importance of incremental diet changes she can build on - advised focus on decreased sweetened beverages work on stress eating. Reviewed starting exercise routine - recommended AHA goals of 17min mod intensity aerobic exercise given fmhx CAD. RTC 3 mo CPE and recheck weight.

## 2019-08-04 NOTE — Assessment & Plan Note (Signed)
Managed by ophtho.

## 2019-08-04 NOTE — Assessment & Plan Note (Signed)
New. Advised start monitoring at home, let me know if consistently elevated. Reviewed low salt diet, regular aerobic exercise, reviewed optimal strategy to check bp. Discussed possible need to start BP med if remaining elevated. Reassess at 3 mo CPE.

## 2019-08-27 MED FILL — D-AMPHETAMINE ER 15 MG CAP: 15 | 90 days supply | Qty: 360 | Fill #0

## 2019-09-05 MED FILL — buPROPion HCL ER (XL) 150 M: 150 | 90 days supply | Qty: 270 | Fill #1

## 2019-09-05 MED FILL — DULOXETINE HCL 60 MG CPEP: 60 | 90 days supply | Qty: 90 | Fill #1

## 2019-10-27 ENCOUNTER — Encounter: Payer: Self-pay | Admitting: Internal Medicine

## 2019-11-02 ENCOUNTER — Encounter: Payer: 59 | Admitting: Family Medicine

## 2019-11-03 ENCOUNTER — Other Ambulatory Visit (INDEPENDENT_AMBULATORY_CARE_PROVIDER_SITE_OTHER): Payer: 59

## 2019-11-03 ENCOUNTER — Other Ambulatory Visit: Payer: Self-pay | Admitting: Family Medicine

## 2019-11-03 ENCOUNTER — Other Ambulatory Visit: Payer: Self-pay

## 2019-11-03 DIAGNOSIS — Z9884 Bariatric surgery status: Secondary | ICD-10-CM

## 2019-11-03 DIAGNOSIS — Z1159 Encounter for screening for other viral diseases: Secondary | ICD-10-CM

## 2019-11-03 DIAGNOSIS — E559 Vitamin D deficiency, unspecified: Secondary | ICD-10-CM | POA: Diagnosis not present

## 2019-11-03 LAB — LIPID PANEL
Cholesterol: 201 mg/dL — ABNORMAL HIGH (ref 0–200)
HDL: 64.4 mg/dL (ref >39.00)
LDL Cholesterol: 120 mg/dL — ABNORMAL HIGH (ref 0–99)
NonHDL: 136.23
Total CHOL/HDL Ratio: 3
Triglycerides: 82 mg/dL (ref 0.0–149.0)
VLDL: 16.4 mg/dL (ref 0.0–40.0)

## 2019-11-03 LAB — CBC WITH DIFFERENTIAL/PLATELET
Basophils Absolute: 0.1 10*3/uL (ref 0.0–0.1)
Basophils Relative: 1.2 % (ref 0.0–3.0)
Eosinophils Absolute: 0.2 10*3/uL (ref 0.0–0.7)
Eosinophils Relative: 3 % (ref 0.0–5.0)
HCT: 36.3 % (ref 36.0–46.0)
Hemoglobin: 11.5 g/dL — ABNORMAL LOW (ref 12.0–15.0)
Lymphocytes Relative: 28 % (ref 12.0–46.0)
Lymphs Abs: 1.9 10*3/uL (ref 0.7–4.0)
MCHC: 31.7 g/dL (ref 30.0–36.0)
MCV: 80.1 fl (ref 78.0–100.0)
Monocytes Absolute: 0.7 10*3/uL (ref 0.1–1.0)
Monocytes Relative: 10.6 % (ref 3.0–12.0)
Neutro Abs: 3.9 10*3/uL (ref 1.4–7.7)
Neutrophils Relative %: 57.2 % (ref 43.0–77.0)
Platelets: 326 10*3/uL (ref 150.0–400.0)
RBC: 4.54 Mil/uL (ref 3.87–5.11)
RDW: 17 % — ABNORMAL HIGH (ref 11.5–15.5)
WBC: 6.9 10*3/uL (ref 4.0–10.5)

## 2019-11-03 LAB — COMPREHENSIVE METABOLIC PANEL
ALT: 12 U/L (ref 0–35)
AST: 13 U/L (ref 0–37)
Albumin: 4.2 g/dL (ref 3.5–5.2)
Alkaline Phosphatase: 70 U/L (ref 39–117)
BUN: 20 mg/dL (ref 6–23)
CO2: 28 mEq/L (ref 19–32)
Calcium: 8.8 mg/dL (ref 8.4–10.5)
Chloride: 103 mEq/L (ref 96–112)
Creatinine, Ser: 0.94 mg/dL (ref 0.40–1.20)
GFR: 59.85 mL/min — ABNORMAL LOW (ref >60.00)
Glucose, Bld: 94 mg/dL (ref 70–99)
Potassium: 3.8 mEq/L (ref 3.5–5.1)
Sodium: 139 mEq/L (ref 135–145)
Total Bilirubin: 0.4 mg/dL (ref 0.2–1.2)
Total Protein: 6.8 g/dL (ref 6.0–8.3)

## 2019-11-03 LAB — IBC PANEL
Iron: 28 ug/dL — ABNORMAL LOW (ref 42–145)
Saturation Ratios: 6.7 % — ABNORMAL LOW (ref 20.0–50.0)
Transferrin: 300 mg/dL (ref 212.0–360.0)

## 2019-11-03 LAB — VITAMIN D 25 HYDROXY (VIT D DEFICIENCY, FRACTURES): VITD: 29.83 ng/mL — ABNORMAL LOW (ref 30.00–100.00)

## 2019-11-03 LAB — TSH: TSH: 2.35 u[IU]/mL (ref 0.35–4.50)

## 2019-11-03 LAB — VITAMIN B12: Vitamin B-12: 265 pg/mL (ref 211–911)

## 2019-11-03 LAB — FOLATE: Folate: 11 ng/mL (ref >5.9)

## 2019-11-03 LAB — FERRITIN: Ferritin: 5.4 ng/mL — ABNORMAL LOW (ref 10.0–291.0)

## 2019-11-04 LAB — HEPATITIS C ANTIBODY
Hepatitis C Ab: NONREACTIVE
SIGNAL TO CUT-OFF: 0.01 (ref <1.00)

## 2019-11-09 ENCOUNTER — Ambulatory Visit (INDEPENDENT_AMBULATORY_CARE_PROVIDER_SITE_OTHER): Payer: 59 | Admitting: Family Medicine

## 2019-11-09 ENCOUNTER — Encounter: Payer: Self-pay | Admitting: Family Medicine

## 2019-11-09 ENCOUNTER — Other Ambulatory Visit: Payer: Self-pay

## 2019-11-09 VITALS — BP 130/82 | HR 103 | Temp 97.7°F | Ht 63.0 in | Wt 205.2 lb

## 2019-11-09 DIAGNOSIS — E538 Deficiency of other specified B group vitamins: Secondary | ICD-10-CM | POA: Insufficient documentation

## 2019-11-09 DIAGNOSIS — D509 Iron deficiency anemia, unspecified: Secondary | ICD-10-CM | POA: Insufficient documentation

## 2019-11-09 DIAGNOSIS — D508 Other iron deficiency anemias: Secondary | ICD-10-CM

## 2019-11-09 DIAGNOSIS — Z9884 Bariatric surgery status: Secondary | ICD-10-CM

## 2019-11-09 DIAGNOSIS — F331 Major depressive disorder, recurrent, moderate: Secondary | ICD-10-CM

## 2019-11-09 DIAGNOSIS — I10 Essential (primary) hypertension: Secondary | ICD-10-CM

## 2019-11-09 DIAGNOSIS — Z0000 Encounter for general adult medical examination without abnormal findings: Secondary | ICD-10-CM

## 2019-11-09 DIAGNOSIS — Z23 Encounter for immunization: Secondary | ICD-10-CM

## 2019-11-09 DIAGNOSIS — E611 Iron deficiency: Secondary | ICD-10-CM | POA: Insufficient documentation

## 2019-11-09 DIAGNOSIS — E559 Vitamin D deficiency, unspecified: Secondary | ICD-10-CM | POA: Diagnosis not present

## 2019-11-09 MED ORDER — B-12 1000 MCG SL SUBL: 1.0000 | SUBLINGUAL_TABLET | Freq: Every day | SUBLINGUAL | Status: DC

## 2019-11-09 MED ORDER — AMLODIPINE BESYLATE 5 MG PO TABS: 5.0000 mg | ORAL_TABLET | Freq: Every day | ORAL | 11 refills | Status: DC

## 2019-11-09 MED ORDER — VITAMIN D3 25 MCG (1000 UT) PO CAPS: 1.0000 | ORAL_CAPSULE | Freq: Every day | ORAL | Status: AC

## 2019-11-09 MED ORDER — FERROUS SULFATE 325 (65 FE) MG PO TBEC: 325.0000 mg | DELAYED_RELEASE_TABLET | ORAL | Status: DC

## 2019-11-09 MED FILL — AMLODIPINE BESYLATE 5 MG TA: 5 | 30 days supply | Qty: 30 | Fill #0

## 2019-11-09 NOTE — Assessment & Plan Note (Addendum)
6lb weight loss noted. Encouraged healthy diet and lifestyle changes.

## 2019-11-09 NOTE — Assessment & Plan Note (Signed)
Vitamin deficiencies reviewed.

## 2019-11-09 NOTE — Assessment & Plan Note (Signed)
Persistently elevated on repeat - as well as at home. Will start amlodipine 5mg  daily. Monitor for bothersome ankle swelling and let us know if this happens.

## 2019-11-09 NOTE — Assessment & Plan Note (Signed)
Preventative protocols reviewed and updated unless pt declined. Discussed healthy diet and lifestyle.  

## 2019-11-09 NOTE — Assessment & Plan Note (Signed)
Mild in setting of sleeve gastrectomy status. rec start oral iron QOD.

## 2019-11-09 NOTE — Patient Instructions (Addendum)
Call us back with date of last tetanus shot.  First shingrix vaccine today. Schedule nurse visit in 2-6 months for second.  Start up vitamin B12 1053mcg, vit D 1000 units daily. Start ferrous sulfate (iron) 325mg  (65Fe) every other day.  Start amlodipine 5mg  daily for blood pressure - watch for ankle swelling as side effect of this medicine.   Health Maintenance for Postmenopausal Women Menopause is a normal process in which your ability to get pregnant comes to an end. This process happens slowly over many months or years, usually between the ages of 26 and 85. Menopause is complete when you have missed your menstrual periods for 12 months. It is important to talk with your health care provider about some of the most common conditions that affect women after menopause (postmenopausal women). These include heart disease, cancer, and bone loss (osteoporosis). Adopting a healthy lifestyle and getting preventive care can help to promote your health and wellness. The actions you take can also lower your chances of developing some of these common conditions. What should I know about menopause? During menopause, you may get a number of symptoms, such as:  Hot flashes. These can be moderate or severe.  Night sweats.  Decrease in sex drive.  Mood swings.  Headaches.  Tiredness.  Irritability.  Memory problems.  Insomnia. Choosing to treat or not to treat these symptoms is a decision that you make with your health care provider. Do I need hormone replacement therapy?  Hormone replacement therapy is effective in treating symptoms that are caused by menopause, such as hot flashes and night sweats.  Hormone replacement carries certain risks, especially as you become older. If you are thinking about using estrogen or estrogen with progestin, discuss the benefits and risks with your health care provider. What is my risk for heart disease and stroke? The risk of heart disease, heart attack, and  stroke increases as you age. One of the causes may be a change in the body's hormones during menopause. This can affect how your body uses dietary fats, triglycerides, and cholesterol. Heart attack and stroke are medical emergencies. There are many things that you can do to help prevent heart disease and stroke. Watch your blood pressure  High blood pressure causes heart disease and increases the risk of stroke. This is more likely to develop in people who have high blood pressure readings, are of African descent, or are overweight.  Have your blood pressure checked: ? Every 3-5 years if you are 79-13 years of age. ? Every year if you are 45 years old or older. Eat a healthy diet   Eat a diet that includes plenty of vegetables, fruits, low-fat dairy products, and lean protein.  Do not eat a lot of foods that are high in solid fats, added sugars, or sodium. Get regular exercise Get regular exercise. This is one of the most important things you can do for your health. Most adults should:  Try to exercise for at least 150 minutes each week. The exercise should increase your heart rate and make you sweat (moderate-intensity exercise).  Try to do strengthening exercises at least twice each week. Do these in addition to the moderate-intensity exercise.  Spend less time sitting. Even light physical activity can be beneficial. Other tips  Work with your health care provider to achieve or maintain a healthy weight.  Do not use any products that contain nicotine or tobacco, such as cigarettes, e-cigarettes, and chewing tobacco. If you need help quitting, ask  your health care provider.  Know your numbers. Ask your health care provider to check your cholesterol and your blood sugar (glucose). Continue to have your blood tested as directed by your health care provider. Do I need screening for cancer? Depending on your health history and family history, you may need to have cancer screening at  different stages of your life. This may include screening for:  Breast cancer.  Cervical cancer.  Lung cancer.  Colorectal cancer. What is my risk for osteoporosis? After menopause, you may be at increased risk for osteoporosis. Osteoporosis is a condition in which bone destruction happens more quickly than new bone creation. To help prevent osteoporosis or the bone fractures that can happen because of osteoporosis, you may take the following actions:  If you are 70-60 years old, get at least 1,000 mg of calcium and at least 600 mg of vitamin D per day.  If you are older than age 104 but younger than age 86, get at least 1,200 mg of calcium and at least 600 mg of vitamin D per day.  If you are older than age 43, get at least 1,200 mg of calcium and at least 800 mg of vitamin D per day. Smoking and drinking excessive alcohol increase the risk of osteoporosis. Eat foods that are rich in calcium and vitamin D, and do weight-bearing exercises several times each week as directed by your health care provider. How does menopause affect my mental health? Depression may occur at any age, but it is more common as you become older. Common symptoms of depression include:  Low or sad mood.  Changes in sleep patterns.  Changes in appetite or eating patterns.  Feeling an overall lack of motivation or enjoyment of activities that you previously enjoyed.  Frequent crying spells. Talk with your health care provider if you think that you are experiencing depression. General instructions See your health care provider for regular wellness exams and vaccines. This may include:  Scheduling regular health, dental, and eye exams.  Getting and maintaining your vaccines. These include: ? Influenza vaccine. Get this vaccine each year before the flu season begins. ? Pneumonia vaccine. ? Shingles vaccine. ? Tetanus, diphtheria, and pertussis (Tdap) booster vaccine. Your health care provider may also  recommend other immunizations. Tell your health care provider if you have ever been abused or do not feel safe at home. Summary  Menopause is a normal process in which your ability to get pregnant comes to an end.  This condition causes hot flashes, night sweats, decreased interest in sex, mood swings, headaches, or lack of sleep.  Treatment for this condition may include hormone replacement therapy.  Take actions to keep yourself healthy, including exercising regularly, eating a healthy diet, watching your weight, and checking your blood pressure and blood sugar levels.  Get screened for cancer and depression. Make sure that you are up to date with all your vaccines. This information is not intended to replace advice given to you by your health care provider. Make sure you discuss any questions you have with your health care provider. Document Released: 01/03/2006 Document Revised: 11/04/2018 Document Reviewed: 11/04/2018 Elsevier Patient Education  2020 [employer].

## 2019-11-09 NOTE — Assessment & Plan Note (Signed)
rec restart vit d replacement 1000 IU daily.

## 2019-11-09 NOTE — Assessment & Plan Note (Signed)
Followed by psych on wellbutrin.

## 2019-11-09 NOTE — Assessment & Plan Note (Signed)
rec restart vit b12 1026mcg dissolvable tablets.

## 2019-11-09 NOTE — Progress Notes (Signed)
This visit was conducted in person.  BP 130/82 (BP Location: Left Arm, Patient Position: Sitting, Cuff Size: Large)   Pulse (!) 103   Temp 97.7 F (36.5 C) (Temporal)   Ht 5\' 3"  (1.6 m)   Wt 205 lb 3 oz (93.1 kg)   SpO2 99%   BMI 36.35 kg/m    CC: CPE Subjective:    Patient ID: Laura Mccarthy, female    DOB: [birth year], 63 y.o.   MRN: [id number]  HPI: Laura Mccarthy is a 63 y.o. female presenting on 11/09/2019 for Annual Exam   BP better controlled today. Home readings elevated - 150/90s 6 lb weight loss noted today.   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 No changing moles on skin. 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 Interim medical history since our last visit reviewed. Allergies and medications reviewed and updated. Outpatient Medications Prior to Visit  Medication Sig Dispense Refill  . acetaminophen (TYLENOL) 500 MG tablet Take 1,000 mg by mouth every 6 (six) hours as needed for mild pain or moderate pain.    Marland Kitchen albuterol (PROVENTIL HFA;VENTOLIN HFA) 108 (90 Base) MCG/ACT inhaler Inhale 2 puffs into the lungs every 6 (six) hours as needed for wheezing or shortness of breath. 1 Inhaler 1  . amphetamine-dextroamphetamine (ADDERALL XR) 15 MG 24 hr capsule Take 2 capsules by mouth every morning.  0  . brimonidine (ALPHAGAN) 0.2  % ophthalmic solution   0  . buPROPion (WELLBUTRIN XL) 150 MG 24 hr tablet Take 3 tablets (450 mg total) by mouth daily.    . DULoxetine (CYMBALTA) 60 MG capsule Take 60 mg by mouth every morning.    . Multiple Vitamins-Minerals (MULTIVITAL PO) Take by mouth.     No facility-administered medications prior to visit.     Per HPI unless specifically indicated in ROS section below Review of Systems  Constitutional: Negative for activity change, appetite change, chills, fatigue, fever and unexpected weight change.  HENT: Negative for hearing loss.   Eyes: Negative for visual disturbance.  Respiratory: Positive for cough (intermittent). Negative for chest tightness, shortness of breath and wheezing.   Cardiovascular: Negative for chest pain, palpitations and leg swelling.  Gastrointestinal: Negative for abdominal distention, abdominal pain, blood in stool, constipation, diarrhea, nausea and vomiting.  Genitourinary: Negative for difficulty urinating and hematuria.  Musculoskeletal: Negative for arthralgias, myalgias and neck pain.  Skin: Negative for rash.  Neurological: Positive for headaches (stress related - start in neck). Negative for dizziness, seizures and syncope.  Hematological: Negative for adenopathy. Does not bruise/bleed easily.  Psychiatric/Behavioral: Negative for dysphoric mood. The patient is not nervous/anxious.    Objective:    BP 130/82 (BP Location: Left Arm, Patient Position: Sitting, Cuff Size: Large)   Pulse (!) 103   Temp 97.7 F (36.5 C) (Temporal)   Ht 5\' 3"  (  1.6 m)   Wt 205 lb 3 oz (93.1 kg)   SpO2 99%   BMI 36.35 kg/m   Wt Readings from Last 3 Encounters:  11/09/19 205 lb 3 oz (93.1 kg)  08/03/19 211 lb 4 oz (95.8 kg)  10/15/18 193 lb 12.8 oz (87.9 kg)    Physical Exam Vitals and nursing note reviewed.  Constitutional:      General: She is not in acute distress.    Appearance: Normal appearance. She is well-developed. She is obese. She is not  ill-appearing.  HENT:     Head: Normocephalic and atraumatic.     Right Ear: Hearing, tympanic membrane, ear canal and external ear normal.     Left Ear: Hearing, tympanic membrane, ear canal and external ear normal.     Nose: Nose normal.     Mouth/Throat:     Pharynx: Uvula midline.  Eyes:     General: No scleral icterus.    Extraocular Movements: Extraocular movements intact.     Conjunctiva/sclera: Conjunctivae normal.     Pupils: Pupils are equal, round, and reactive to light.  Cardiovascular:     Rate and Rhythm: Normal rate and regular rhythm.     Pulses: Normal pulses.          Radial pulses are 2+ on the right side and 2+ on the left side.     Heart sounds: Normal heart sounds. No murmur.  Pulmonary:     Effort: Pulmonary effort is normal. No respiratory distress.     Breath sounds: Normal breath sounds. No wheezing, rhonchi or rales.  Abdominal:     General: Abdomen is flat. Bowel sounds are normal. There is no distension.     Palpations: Abdomen is soft. There is no mass.     Tenderness: There is no abdominal tenderness. There is no guarding or rebound.     Hernia: No hernia is present.  Musculoskeletal:        General: Normal range of motion.     Cervical back: Normal range of motion and neck supple.     Right lower leg: No edema.     Left lower leg: No edema.  Lymphadenopathy:     Cervical: No cervical adenopathy.  Skin:    General: Skin is warm and dry.     Findings: No rash.  Neurological:     General: No focal deficit present.     Mental Status: She is alert and oriented to person, place, and time.     Comments: CN grossly intact, station and gait intact  Psychiatric:        Mood and Affect: Mood normal.        Behavior: Behavior normal.        Thought Content: Thought content normal.        Judgment: Judgment normal.       Results for orders placed or performed in visit on 11/03/19  Vitamin B12  Result Value Ref Range   Vitamin B-12 265 211 - 911  pg/mL  IBC panel  Result Value Ref Range   Iron 28 (L) 42 - 145 ug/dL   Transferrin 300.0 212.0 - 360.0 mg/dL   Saturation Ratios 6.7 (L) 20.0 - 50.0 %  Folate  Result Value Ref Range   Folate 11.0 >5.9 ng/mL  Ferritin  Result Value Ref Range   Ferritin 5.4 (L) 10.0 - 291.0 ng/mL  CBC with Differential  Result Value Ref Range   WBC 6.9 4.0 - 10.5  K/uL   RBC 4.54 3.87 - 5.11 Mil/uL   Hemoglobin 11.5 (L) 12.0 - 15.0 g/dL   HCT 36.3 36.0 - 46.0 %   MCV 80.1 78.0 - 100.0 fl   MCHC 31.7 30.0 - 36.0 g/dL   RDW 17.0 (H) 11.5 - 15.5 %   Platelets 326.0 150.0 - 400.0 K/uL   Neutrophils Relative % 57.2 43.0 - 77.0 %   Lymphocytes Relative 28.0 12.0 - 46.0 %   Monocytes Relative 10.6 3.0 - 12.0 %   Eosinophils Relative 3.0 0.0 - 5.0 %   Basophils Relative 1.2 0.0 - 3.0 %   Neutro Abs 3.9 1.4 - 7.7 K/uL   Lymphs Abs 1.9 0.7 - 4.0 K/uL   Monocytes Absolute 0.7 0.1 - 1.0 K/uL   Eosinophils Absolute 0.2 0.0 - 0.7 K/uL   Basophils Absolute 0.1 0.0 - 0.1 K/uL  TSH  Result Value Ref Range   TSH 2.35 0.35 - 4.50 uIU/mL  Hepatitis C Antibody  Result Value Ref Range   Hepatitis C Ab NON-REACTIVE NON-REACTI   SIGNAL TO CUT-OFF 0.01 <1.00  vit d  Result Value Ref Range   VITD 29.83 (L) 30.00 - 100.00 ng/mL  Comprehensive metabolic panel  Result Value Ref Range   Sodium 139 135 - 145 mEq/L   Potassium 3.8 3.5 - 5.1 mEq/L   Chloride 103 96 - 112 mEq/L   CO2 28 19 - 32 mEq/L   Glucose, Bld 94 70 - 99 mg/dL   BUN 20 6 - 23 mg/dL   Creatinine, Ser 0.94 0.40 - 1.20 mg/dL   Total Bilirubin 0.4 0.2 - 1.2 mg/dL   Alkaline Phosphatase 70 39 - 117 U/L   AST 13 0 - 37 U/L   ALT 12 0 - 35 U/L   Total Protein 6.8 6.0 - 8.3 g/dL   Albumin 4.2 3.5 - 5.2 g/dL   GFR 59.85 (L) >60.00 mL/min   Calcium 8.8 8.4 - 10.5 mg/dL  Lipid panel  Result Value Ref Range   Cholesterol 201 (H) 0 - 200 mg/dL   Triglycerides 82.0 0.0 - 149.0 mg/dL   HDL 64.40 >39.00 mg/dL   VLDL 16.4 0.0 - 40.0 mg/dL   LDL  Cholesterol 120 (H) 0 - 99 mg/dL   Total CHOL/HDL Ratio 3    NonHDL 136.23    Assessment & Plan:  This visit occurred during the SARS-CoV-2 public health emergency.  Safety protocols were in place, including screening questions prior to the visit, additional usage of staff PPE, and extensive cleaning of exam room while observing appropriate contact time as indicated for disinfecting solutions.   Problem List Items Addressed This Visit    Vitamin D deficiency    rec restart vit d replacement 1000 IU daily.       Vitamin B12 deficiency    rec restart vit b12 1045mcg dissolvable tablets.       Severe obesity (BMI 35.0-39.9) with comorbidity ([place])    6lb weight loss noted. Encouraged healthy diet and lifestyle changes.       S/P laparoscopic sleeve gastrectomy    Vitamin deficiencies reviewed.       MDD (major depressive disorder), recurrent episode, moderate ([place])    Followed by psych on wellbutrin.      Iron deficiency anemia    Mild in setting of sleeve gastrectomy status. rec start oral iron QOD.       Relevant Medications   Cyanocobalamin (B-12) 1000 MCG SUBL   ferrous sulfate 325 (  65 FE) MG EC tablet   Health maintenance examination - Primary    Preventative protocols reviewed and updated unless pt declined. Discussed healthy diet and lifestyle.       Essential hypertension    Persistently elevated on repeat - as well as at home. Will start amlodipine 5mg  daily. Monitor for bothersome ankle swelling and let us know if this happens.      Relevant Medications   amLODipine (NORVASC) 5 MG tablet    Other Visit Diagnoses    Need for shingles vaccine       Relevant Orders   Varicella-zoster vaccine IM (Completed)       Meds ordered this encounter  Medications  . amLODipine (NORVASC) 5 MG tablet    Sig: Take 1 tablet (5 mg total) by mouth daily.    Dispense:  30 tablet    Refill:  11  . Cyanocobalamin (B-12) 1000 MCG SUBL    Sig: Place 1 tablet under the tongue  daily.  . Cholecalciferol (VITAMIN D3) 25 MCG (1000 UT) CAPS    Sig: Take 1 capsule (1,000 Units total) by mouth daily.    Dispense:  30 capsule  . ferrous sulfate 325 (65 FE) MG EC tablet    Sig: Take 1 tablet (325 mg total) by mouth every other day.   Orders Placed This Encounter  Procedures  . Varicella-zoster vaccine IM    Patient instructions: Call us back with date of last tetanus shot.  First shingrix vaccine today. Schedule nurse visit in 2-6 months for second.  Start up vitamin B12 1064mcg, vit D 1000 units daily. Start ferrous sulfate (iron) 325mg  (65Fe) every other day.  Start amlodipine 5mg  daily for blood pressure - watch for ankle swelling as side effect of this medicine.   Follow up plan: Return in about 3 months (around 02/07/2020) for follow up visit.  Ria Bush, MD

## 2019-12-07 MED FILL — NALTREXONE 50 MG TABLET: 50 | 90 days supply | Qty: 90 | Fill #0

## 2019-12-07 MED FILL — D-AMPHETAMINE ER 15 MG CAP: 15 | 90 days supply | Qty: 360 | Fill #0

## 2019-12-10 MED FILL — AMLODIPINE BESYLATE 5 MG TA: 5 | 30 days supply | Qty: 30 | Fill #1

## 2019-12-10 MED FILL — BRIMONIDINE TARTRATE 0.2 %: 0.2 | 75 days supply | Qty: 15 | Fill #2

## 2019-12-22 MED FILL — buPROPion HCL ER (XL) 150 M: 150 | 90 days supply | Qty: 270 | Fill #2

## 2019-12-22 MED FILL — DULOXETINE HCL 60 MG CPEP: 60 | 90 days supply | Qty: 90 | Fill #2

## 2020-01-12 ENCOUNTER — Ambulatory Visit: Payer: 59

## 2020-01-14 MED FILL — AMLODIPINE BESYLATE 5 MG TA: 5 | 90 days supply | Qty: 90 | Fill #2

## 2020-02-08 ENCOUNTER — Encounter: Payer: Self-pay | Admitting: Family Medicine

## 2020-02-08 ENCOUNTER — Ambulatory Visit: Payer: 59 | Admitting: Family Medicine

## 2020-02-08 ENCOUNTER — Other Ambulatory Visit: Payer: Self-pay

## 2020-02-08 VITALS — BP 144/90 | HR 98 | Temp 97.5°F | Ht 63.0 in | Wt 206.6 lb

## 2020-02-08 DIAGNOSIS — Z23 Encounter for immunization: Secondary | ICD-10-CM

## 2020-02-08 DIAGNOSIS — I10 Essential (primary) hypertension: Secondary | ICD-10-CM

## 2020-02-08 MED ORDER — AMLODIPINE BESYLATE 10 MG PO TABS: 10.0000 mg | ORAL_TABLET | Freq: Every day | ORAL | 1 refills | Status: DC

## 2020-02-08 NOTE — Progress Notes (Addendum)
This visit was conducted in person.  BP (!) 144/90 (BP Location: Right Arm, Cuff Size: Large)   Pulse 98   Temp (!) 97.5 F (36.4 C) (Temporal)   Ht 5\' 3"  (1.6 m)   Wt 206 lb 9 oz (93.7 kg)   SpO2 99%   BMI 36.59 kg/m   BP Readings from Last 3 Encounters:  02/08/20 (!) 144/90  11/09/19 130/82  08/03/19 (!) 142/84    CC: HTN f/u Subjective:    Patient ID: Laura Mccarthy, female    DOB: 12-May-1956, 64 y.o.   MRN: [id number]  HPI: Laura Mccarthy is a 64 y.o. female presenting on 02/08/2020 for Hypertension (Here for 3 mo. )   HTN - Compliant with current antihypertensive regimen of amlodipine 5mg  daily.  Does check blood pressures at home: brings log (see below). No low blood pressure readings or symptoms of dizziness/syncope. Denies HA, vision changes, CP/tightness, SOB, leg swelling.    BP log : 130-150/70-80s, HR 69-114.      Relevant past medical, surgical, family and social history reviewed and updated as indicated. Interim medical history since our last visit reviewed. Allergies and medications reviewed and updated. Outpatient Medications Prior to Visit  Medication Sig Dispense Refill  . acetaminophen (TYLENOL) 500 MG tablet Take 1,000 mg by mouth every 6 (six) hours as needed for mild pain or moderate pain.    Marland Kitchen albuterol (PROVENTIL HFA;VENTOLIN HFA) 108 (90 Base) MCG/ACT inhaler Inhale 2 puffs into the lungs every 6 (six) hours as needed for wheezing or shortness of breath. 1 Inhaler 1  . amphetamine-dextroamphetamine (ADDERALL XR) 15 MG 24 hr capsule Take 2 capsules by mouth every morning.  0  . brimonidine (ALPHAGAN) 0.2 % ophthalmic solution   0  . buPROPion (WELLBUTRIN XL) 150 MG 24 hr tablet Take 3 tablets (450 mg total) by mouth daily.    . Cholecalciferol (VITAMIN D3) 25 MCG (1000 UT) CAPS Take 1 capsule (1,000 Units total) by mouth daily. 30 capsule   . Cyanocobalamin (B-12) 1000 MCG SUBL Place 1 tablet under the tongue daily.    . DULoxetine (CYMBALTA) 60 MG  capsule Take 60 mg by mouth every morning.    . ferrous sulfate 325 (65 FE) MG EC tablet Take 1 tablet (325 mg total) by mouth every other day.    . Multiple Vitamins-Minerals (MULTIVITAL PO) Take by mouth.    Marland Kitchen amLODipine (NORVASC) 5 MG tablet Take 1 tablet (5 mg total) by mouth daily. 30 tablet 11   No facility-administered medications prior to visit.     Per HPI unless specifically indicated in ROS section below Review of Systems Objective:    BP (!) 144/90 (BP Location: Right Arm, Cuff Size: Large)   Pulse 98   Temp (!) 97.5 F (36.4 C) (Temporal)   Ht 5\' 3"  (1.6 m)   Wt 206 lb 9 oz (93.7 kg)   SpO2 99%   BMI 36.59 kg/m   Wt Readings from Last 3 Encounters:  02/08/20 206 lb 9 oz (93.7 kg)  11/09/19 205 lb 3 oz (93.1 kg)  08/03/19 211 lb 4 oz (95.8 kg)    Physical Exam Vitals and nursing note reviewed.  Constitutional:      Appearance: Normal appearance. She is obese. She is not ill-appearing.  Cardiovascular:     Rate and Rhythm: Normal rate and regular rhythm.     Pulses: Normal pulses.     Heart sounds: Normal heart sounds. No murmur.  Pulmonary:  Effort: Pulmonary effort is normal. No respiratory distress.     Breath sounds: Normal breath sounds. No wheezing, rhonchi or rales.  Neurological:     Mental Status: She is alert.  Psychiatric:        Mood and Affect: Mood normal.        Behavior: Behavior normal.       Results for orders placed or performed in visit on 11/03/19  Vitamin B12  Result Value Ref Range   Vitamin B-12 265 211 - 911 pg/mL  IBC panel  Result Value Ref Range   Iron 28 (L) 42 - 145 ug/dL   Transferrin 300.0 212.0 - 360.0 mg/dL   Saturation Ratios 6.7 (L) 20.0 - 50.0 %  Folate  Result Value Ref Range   Folate 11.0 >5.9 ng/mL  Ferritin  Result Value Ref Range   Ferritin 5.4 (L) 10.0 - 291.0 ng/mL  CBC with Differential  Result Value Ref Range   WBC 6.9 4.0 - 10.5 K/uL   RBC 4.54 3.87 - 5.11 Mil/uL   Hemoglobin 11.5 (L) 12.0 -  15.0 g/dL   HCT 36.3 36.0 - 46.0 %   MCV 80.1 78.0 - 100.0 fl   MCHC 31.7 30.0 - 36.0 g/dL   RDW 17.0 (H) 11.5 - 15.5 %   Platelets 326.0 150.0 - 400.0 K/uL   Neutrophils Relative % 57.2 43.0 - 77.0 %   Lymphocytes Relative 28.0 12.0 - 46.0 %   Monocytes Relative 10.6 3.0 - 12.0 %   Eosinophils Relative 3.0 0.0 - 5.0 %   Basophils Relative 1.2 0.0 - 3.0 %   Neutro Abs 3.9 1.4 - 7.7 K/uL   Lymphs Abs 1.9 0.7 - 4.0 K/uL   Monocytes Absolute 0.7 0.1 - 1.0 K/uL   Eosinophils Absolute 0.2 0.0 - 0.7 K/uL   Basophils Absolute 0.1 0.0 - 0.1 K/uL  TSH  Result Value Ref Range   TSH 2.35 0.35 - 4.50 uIU/mL  Hepatitis C Antibody  Result Value Ref Range   Hepatitis C Ab NON-REACTIVE NON-REACTI   SIGNAL TO CUT-OFF 0.01 <1.00  vit d  Result Value Ref Range   VITD 29.83 (L) 30.00 - 100.00 ng/mL  Comprehensive metabolic panel  Result Value Ref Range   Sodium 139 135 - 145 mEq/L   Potassium 3.8 3.5 - 5.1 mEq/L   Chloride 103 96 - 112 mEq/L   CO2 28 19 - 32 mEq/L   Glucose, Bld 94 70 - 99 mg/dL   BUN 20 6 - 23 mg/dL   Creatinine, Ser 0.94 0.40 - 1.20 mg/dL   Total Bilirubin 0.4 0.2 - 1.2 mg/dL   Alkaline Phosphatase 70 39 - 117 U/L   AST 13 0 - 37 U/L   ALT 12 0 - 35 U/L   Total Protein 6.8 6.0 - 8.3 g/dL   Albumin 4.2 3.5 - 5.2 g/dL   GFR 59.85 (L) >60.00 mL/min   Calcium 8.8 8.4 - 10.5 mg/dL  Lipid panel  Result Value Ref Range   Cholesterol 201 (H) 0 - 200 mg/dL   Triglycerides 82.0 0.0 - 149.0 mg/dL   HDL 64.40 >39.00 mg/dL   VLDL 16.4 0.0 - 40.0 mg/dL   LDL Cholesterol 120 (H) 0 - 99 mg/dL   Total CHOL/HDL Ratio 3    NonHDL 136.23    Assessment & Plan:  This visit occurred during the SARS-CoV-2 public health emergency.  Safety protocols were in place, including screening questions prior to the visit, additional usage  of staff PPE, and extensive cleaning of exam room while observing appropriate contact time as indicated for disinfecting solutions.   Problem List Items  Addressed This Visit    Essential hypertension - Primary    Chronic, improved some but persistently elevated. Will increase amlodipine to 10mg  daily. Take 2 until she runs out.  RTC 3 mo HTN f/u visit.       Relevant Medications   amLODipine (NORVASC) 10 MG tablet    Other Visit Diagnoses    Need for shingles vaccine       Relevant Orders   Varicella-zoster vaccine IM (Completed)       Meds ordered this encounter  Medications  . amLODipine (NORVASC) 10 MG tablet    Sig: Take 1 tablet (10 mg total) by mouth daily.    Dispense:  90 tablet    Refill:  1    Note new sig - hold until next refill due - pt just filled 5mg  amlodipine 90d supply   Orders Placed This Encounter  Procedures  . Varicella-zoster vaccine IM    Patient Instructions  2nd shingrix vaccine today.  BP staying elevated - let's increase amlodipine to 10mg  daily.  Take both at the same time.  I think this should help control blood pressures - ideally want 120-130s/70-80s.  Return in 3 months for follow up visit.    Follow up plan: Return in about 3 months (around 05/10/2020) for follow up visit.  Ria Bush, MD

## 2020-02-08 NOTE — Patient Instructions (Addendum)
2nd shingrix vaccine today.  BP staying elevated - let's increase amlodipine to 10mg  daily.  Take both at the same time.  I think this should help control blood pressures - ideally want 120-130s/70-80s.  Return in 3 months for follow up visit.

## 2020-02-08 NOTE — Addendum Note (Signed)
Addended by: Brenton Grills on: [id number] PM   Modules accepted: Orders

## 2020-02-08 NOTE — Assessment & Plan Note (Signed)
Chronic, improved some but persistently elevated. Will increase amlodipine to 10mg  daily. Take 2 until she runs out.  RTC 3 mo HTN f/u visit.

## 2020-03-14 ENCOUNTER — Telehealth: Payer: Self-pay

## 2020-03-14 DIAGNOSIS — H2513 Age-related nuclear cataract, bilateral: Secondary | ICD-10-CM | POA: Diagnosis not present

## 2020-03-14 DIAGNOSIS — H04123 Dry eye syndrome of bilateral lacrimal glands: Secondary | ICD-10-CM | POA: Diagnosis not present

## 2020-03-14 DIAGNOSIS — H52223 Regular astigmatism, bilateral: Secondary | ICD-10-CM | POA: Diagnosis not present

## 2020-03-14 DIAGNOSIS — H40023 Open angle with borderline findings, high risk, bilateral: Secondary | ICD-10-CM | POA: Diagnosis not present

## 2020-03-14 DIAGNOSIS — H40053 Ocular hypertension, bilateral: Secondary | ICD-10-CM | POA: Diagnosis not present

## 2020-03-14 DIAGNOSIS — H524 Presbyopia: Secondary | ICD-10-CM | POA: Diagnosis not present

## 2020-03-14 MED FILL — AMLODIPINE BESYLATE 10 MG T: 10 | 90 days supply | Qty: 90 | Fill #0

## 2020-03-14 NOTE — Telephone Encounter (Signed)
Pt requesting new rx of amlodipine 10 mg; I spoke with Brayton Layman at [facility]; Brayton Layman already has amlodipine 10 mg on hold and will suspend the amlodipine 5 mg and fill the amlodipine 10 mg taking one daily. Pt notified and voiced understanding. Nothing further needed.

## 2020-03-22 MED FILL — NALTREXONE 50 MG TABLET: 50 | 90 days supply | Qty: 90 | Fill #1

## 2020-04-19 DIAGNOSIS — H11153 Pinguecula, bilateral: Secondary | ICD-10-CM | POA: Diagnosis not present

## 2020-04-19 DIAGNOSIS — H2513 Age-related nuclear cataract, bilateral: Secondary | ICD-10-CM | POA: Diagnosis not present

## 2020-04-19 DIAGNOSIS — H40053 Ocular hypertension, bilateral: Secondary | ICD-10-CM | POA: Diagnosis not present

## 2020-04-19 DIAGNOSIS — H04123 Dry eye syndrome of bilateral lacrimal glands: Secondary | ICD-10-CM | POA: Diagnosis not present

## 2020-04-19 MED FILL — SIMBRINZA 1-0.2 % SUSP: 1-0.2 | 40 days supply | Qty: 8 | Fill #0

## 2020-05-16 ENCOUNTER — Other Ambulatory Visit: Payer: Self-pay

## 2020-05-16 ENCOUNTER — Ambulatory Visit: Payer: 59 | Admitting: Family Medicine

## 2020-05-16 ENCOUNTER — Encounter: Payer: Self-pay | Admitting: Family Medicine

## 2020-05-16 VITALS — BP 126/72 | HR 91 | Temp 98.2°F | Ht 63.0 in | Wt 200.4 lb

## 2020-05-16 DIAGNOSIS — Z9884 Bariatric surgery status: Secondary | ICD-10-CM | POA: Diagnosis not present

## 2020-05-16 DIAGNOSIS — E538 Deficiency of other specified B group vitamins: Secondary | ICD-10-CM | POA: Diagnosis not present

## 2020-05-16 DIAGNOSIS — D508 Other iron deficiency anemias: Secondary | ICD-10-CM

## 2020-05-16 DIAGNOSIS — I10 Essential (primary) hypertension: Secondary | ICD-10-CM | POA: Diagnosis not present

## 2020-05-16 NOTE — Progress Notes (Signed)
This visit was conducted in person.  BP 126/72 (BP Location: Left Arm, Patient Position: Sitting, Cuff Size: Large)   Pulse 91   Temp 98.2 F (36.8 C) (Temporal)   Ht 5\' 3"  (1.6 m)   Wt 200 lb 6 oz (90.9 kg)   SpO2 98%   BMI 35.49 kg/m    CC: 3 mo f/u visit  Subjective:    Patient ID: Laura Mccarthy, female    DOB: November 12, 1956, 64 y.o.   MRN: [id number]  HPI: Laura Mccarthy is a 64 y.o. female presenting on 05/16/2020 for Hypertension (Here for 3 mo f/u.)   HTN - Compliant with current antihypertensive regimen of amlodipine 10mg  daily. Does check blood pressures at home: [id number] systolic but largely improved. No low blood pressure readings or symptoms of dizziness/syncope. Denies HA, vision changes, CP/tightness, SOB, leg swelling.    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      Relevant past medical, surgical, family and social history reviewed and updated as indicated. Interim medical history since our last visit reviewed. Allergies and medications reviewed and updated. Outpatient Medications Prior to Visit  Medication Sig Dispense Refill  . acetaminophen (TYLENOL) 500 MG tablet Take 1,000 mg by mouth every 6 (six) hours as needed for mild pain or moderate pain.    Marland Kitchen albuterol (PROVENTIL HFA;VENTOLIN HFA) 108 (90 Base) MCG/ACT inhaler Inhale 2 puffs into the lungs every 6 (six) hours as needed for wheezing or shortness of breath. 1 Inhaler 1  . amLODipine (NORVASC) 10 MG tablet Take 1 tablet (10 mg total) by mouth daily. 90 tablet 1  . amphetamine-dextroamphetamine (ADDERALL XR) 15 MG 24 hr capsule Take 2 capsules by mouth every morning.  0  . brimonidine (ALPHAGAN) 0.2 % ophthalmic solution   0  . buPROPion (WELLBUTRIN XL) 150 MG 24 hr tablet Take 3 tablets (450 mg total) by mouth daily.    . Cholecalciferol (VITAMIN D3) 25 MCG (1000 UT) CAPS Take 1 capsule (1,000 Units total) by mouth daily. 30 capsule   . Cyanocobalamin (B-12) 1000 MCG SUBL Place 1 tablet under  the tongue daily.    . DULoxetine (CYMBALTA) 60 MG capsule Take 60 mg by mouth every morning.    . ferrous sulfate 325 (65 FE) MG EC tablet Take 1 tablet (325 mg total) by mouth every other day.    . Multiple Vitamins-Minerals (MULTIVITAL PO) Take by mouth.     No facility-administered medications prior to visit.     Per HPI unless specifically indicated in ROS section below Review of Systems Objective:  BP 126/72 (BP Location: Left Arm, Patient Position: Sitting, Cuff Size: Large)   Pulse 91   Temp 98.2 F (36.8 C) (Temporal)   Ht 5\' 3"  (1.6 m)   Wt 200 lb 6 oz (90.9 kg)   SpO2 98%   BMI 35.49 kg/m   Wt Readings from Last 3 Encounters:  05/16/20 200 lb 6 oz (90.9 kg)  02/08/20 206 lb 9 oz (93.7 kg)  11/09/19 205 lb 3 oz (93.1 kg)      Physical Exam Vitals and nursing note reviewed.  Constitutional:      Appearance: Normal appearance. She is not ill-appearing.  Cardiovascular:     Rate and Rhythm: Normal rate and regular rhythm.     Pulses: Normal pulses.     Heart sounds: Normal heart sounds. No murmur heard.   Pulmonary:     Effort: Pulmonary effort is normal. No respiratory distress.  Breath sounds: Normal breath sounds. No wheezing, rhonchi or rales.  Musculoskeletal:     Right lower leg: No edema.     Left lower leg: No edema.  Neurological:     Mental Status: She is alert.  Psychiatric:        Mood and Affect: Mood normal.        Behavior: Behavior normal.       Results for orders placed or performed in visit on 11/03/19  Vitamin B12  Result Value Ref Range   Vitamin B-12 265 211 - 911 pg/mL  IBC panel  Result Value Ref Range   Iron 28 (L) 42 - 145 ug/dL   Transferrin 300.0 212.0 - 360.0 mg/dL   Saturation Ratios 6.7 (L) 20.0 - 50.0 %  Folate  Result Value Ref Range   Folate 11.0 >5.9 ng/mL  Ferritin  Result Value Ref Range   Ferritin 5.4 (L) 10.0 - 291.0 ng/mL  CBC with Differential  Result Value Ref Range   WBC 6.9 4.0 - 10.5 K/uL   RBC  4.54 3.87 - 5.11 Mil/uL   Hemoglobin 11.5 (L) 12.0 - 15.0 g/dL   HCT 36.3 36 - 46 %   MCV 80.1 78.0 - 100.0 fl   MCHC 31.7 30.0 - 36.0 g/dL   RDW 17.0 (H) 11.5 - 15.5 %   Platelets 326.0 150 - 400 K/uL   Neutrophils Relative % 57.2 43 - 77 %   Lymphocytes Relative 28.0 12 - 46 %   Monocytes Relative 10.6 3 - 12 %   Eosinophils Relative 3.0 0 - 5 %   Basophils Relative 1.2 0 - 3 %   Neutro Abs 3.9 1.4 - 7.7 K/uL   Lymphs Abs 1.9 0.7 - 4.0 K/uL   Monocytes Absolute 0.7 0 - 1 K/uL   Eosinophils Absolute 0.2 0 - 0 K/uL   Basophils Absolute 0.1 0 - 0 K/uL  TSH  Result Value Ref Range   TSH 2.35 0.35 - 4.50 uIU/mL  Hepatitis C Antibody  Result Value Ref Range   Hepatitis C Ab NON-REACTIVE NON-REACTI   SIGNAL TO CUT-OFF 0.01 <1.00  vit d  Result Value Ref Range   VITD 29.83 (L) 30.00 - 100.00 ng/mL  Comprehensive metabolic panel  Result Value Ref Range   Sodium 139 135 - 145 mEq/L   Potassium 3.8 3.5 - 5.1 mEq/L   Chloride 103 96 - 112 mEq/L   CO2 28 19 - 32 mEq/L   Glucose, Bld 94 70 - 99 mg/dL   BUN 20 6 - 23 mg/dL   Creatinine, Ser 0.94 0.40 - 1.20 mg/dL   Total Bilirubin 0.4 0.2 - 1.2 mg/dL   Alkaline Phosphatase 70 39 - 117 U/L   AST 13 0 - 37 U/L   ALT 12 0 - 35 U/L   Total Protein 6.8 6.0 - 8.3 g/dL   Albumin 4.2 3.5 - 5.2 g/dL   GFR 59.85 (L) >60.00 mL/min   Calcium 8.8 8.4 - 10.5 mg/dL  Lipid panel  Result Value Ref Range   Cholesterol 201 (H) 0 - 200 mg/dL   Triglycerides 82.0 0 - 149 mg/dL   HDL 64.40 >39.00 mg/dL   VLDL 16.4 0.0 - 40.0 mg/dL   LDL Cholesterol 120 (H) 0 - 99 mg/dL   Total CHOL/HDL Ratio 3    NonHDL 136.23    Assessment & Plan:  This visit occurred during the SARS-CoV-2 public health emergency.  Safety protocols were in place, including  screening questions prior to the visit, additional usage of staff PPE, and extensive cleaning of exam room while observing appropriate contact time as indicated for disinfecting solutions.   Problem List  Items Addressed This Visit    Vitamin B12 deficiency    Update B12 level.       Relevant Orders   Vitamin B12   S/P laparoscopic sleeve gastrectomy   Iron deficiency anemia    Has regularly been taking ferrous sulfate 325mg  QOD for the past 6 months - update iron panel with CBC.      Relevant Orders   Ferritin   IBC panel   CBC with Differential/Platelet   Essential hypertension - Primary    Chronic, improving. Reviewed optimal strategy to check BP at home. Continue amlodipine 10mg  daily.           No orders of the defined types were placed in this encounter.  Orders Placed This Encounter  Procedures  . Vitamin B12  . Ferritin  . IBC panel  . CBC with Differential/Platelet    Patient Instructions  Blood pressures are looking better! Continue amlodipine 10mg  daily. Wait 30 min between BP check and any food, drink, exercise, upset, pain, etc to ensure we're getting accurate readings. Labs today.  Return as needed or in 6 months for physical.    Follow up plan: Return in about 6 months (around 11/15/2020) for annual exam, prior fasting for blood work.  Ria Bush, MD

## 2020-05-16 NOTE — Patient Instructions (Signed)
Blood pressures are looking better! Continue amlodipine 10mg  daily. Wait 30 min between BP check and any food, drink, exercise, upset, pain, etc to ensure we're getting accurate readings. Labs today.  Return as needed or in 6 months for physical.

## 2020-05-16 NOTE — Assessment & Plan Note (Signed)
Has regularly been taking ferrous sulfate 325mg  QOD for the past 6 months - update iron panel with CBC.

## 2020-05-16 NOTE — Assessment & Plan Note (Signed)
Update B12 level 

## 2020-05-16 NOTE — Assessment & Plan Note (Signed)
Chronic, improving. Reviewed optimal strategy to check BP at home. Continue amlodipine 10mg  daily.

## 2020-05-17 LAB — IBC PANEL
Iron: 73 ug/dL (ref 42–145)
Saturation Ratios: 18.2 % — ABNORMAL LOW (ref 20.0–50.0)
Transferrin: 286 mg/dL (ref 212.0–360.0)

## 2020-05-17 LAB — CBC WITH DIFFERENTIAL/PLATELET
Basophils Absolute: 0.1 10*3/uL (ref 0.0–0.1)
Basophils Relative: 1.2 % (ref 0.0–3.0)
Eosinophils Absolute: 0.2 10*3/uL (ref 0.0–0.7)
Eosinophils Relative: 2 % (ref 0.0–5.0)
HCT: 40 % (ref 36.0–46.0)
Hemoglobin: 13.3 g/dL (ref 12.0–15.0)
Lymphocytes Relative: 27.9 % (ref 12.0–46.0)
Lymphs Abs: 2.4 10*3/uL (ref 0.7–4.0)
MCHC: 33.3 g/dL (ref 30.0–36.0)
MCV: 90.1 fl (ref 78.0–100.0)
Monocytes Absolute: 0.8 10*3/uL (ref 0.1–1.0)
Monocytes Relative: 9.7 % (ref 3.0–12.0)
Neutro Abs: 5.2 10*3/uL (ref 1.4–7.7)
Neutrophils Relative %: 59.2 % (ref 43.0–77.0)
Platelets: 314 10*3/uL (ref 150.0–400.0)
RBC: 4.44 Mil/uL (ref 3.87–5.11)
RDW: 15.3 % (ref 11.5–15.5)
WBC: 8.7 10*3/uL (ref 4.0–10.5)

## 2020-05-17 LAB — VITAMIN B12: Vitamin B-12: 1405 pg/mL — ABNORMAL HIGH (ref 211–911)

## 2020-05-17 LAB — FERRITIN: Ferritin: 13 ng/mL (ref 10.0–291.0)

## 2020-05-18 ENCOUNTER — Other Ambulatory Visit: Payer: Self-pay | Admitting: Family Medicine

## 2020-05-18 MED ORDER — B-12 1000 MCG SL SUBL: 1.0000 | SUBLINGUAL_TABLET | SUBLINGUAL | Status: DC

## 2020-06-05 MED FILL — SIMBRINZA 1-0.2 % SUSP: 1-0.2 | 40 days supply | Qty: 8 | Fill #1

## 2020-06-08 MED FILL — D-AMPHETAMINE ER 15 MG CAP: 15 | 90 days supply | Qty: 360 | Fill #0

## 2020-06-22 MED FILL — AMLODIPINE BESYLATE 10 MG T: 10 | 90 days supply | Qty: 90 | Fill #1

## 2020-06-27 ENCOUNTER — Other Ambulatory Visit: Payer: Self-pay | Admitting: Family Medicine

## 2020-06-27 DIAGNOSIS — Z1231 Encounter for screening mammogram for malignant neoplasm of breast: Secondary | ICD-10-CM

## 2020-07-12 ENCOUNTER — Ambulatory Visit
Admission: RE | Admit: 2020-07-12 | Discharge: 2020-07-12 | Disposition: A | Discharge status: Home / Self Care | Payer: 59 | Source: Ambulatory Visit | Attending: Family Medicine | Admitting: Family Medicine

## 2020-07-12 ENCOUNTER — Other Ambulatory Visit: Payer: Self-pay

## 2020-07-12 DIAGNOSIS — Z1231 Encounter for screening mammogram for malignant neoplasm of breast: Secondary | ICD-10-CM

## 2020-07-13 LAB — HM MAMMOGRAPHY

## 2020-07-14 ENCOUNTER — Encounter: Payer: Self-pay | Admitting: Family Medicine

## 2020-07-18 MED FILL — NALTREXONE 50 MG TABLET: 50 | 90 days supply | Qty: 90 | Fill #2

## 2020-07-18 MED FILL — buPROPion HCL ER (XL) 150 M: 150 | 90 days supply | Qty: 270 | Fill #1

## 2020-07-18 MED FILL — DULOXETINE HCL 60 MG CPEP: 60 | 90 days supply | Qty: 90 | Fill #1

## 2020-08-25 ENCOUNTER — Ambulatory Visit (INDEPENDENT_AMBULATORY_CARE_PROVIDER_SITE_OTHER): Payer: 59

## 2020-08-25 ENCOUNTER — Other Ambulatory Visit: Payer: Self-pay

## 2020-08-25 DIAGNOSIS — Z23 Encounter for immunization: Secondary | ICD-10-CM | POA: Diagnosis not present

## 2020-10-02 ENCOUNTER — Other Ambulatory Visit: Payer: Self-pay | Admitting: Family Medicine

## 2020-10-03 ENCOUNTER — Other Ambulatory Visit: Payer: Self-pay | Admitting: Family Medicine

## 2020-10-03 MED FILL — AMLODIPINE BESYLATE 10 MG T: 10 | 90 days supply | Qty: 90 | Fill #0

## 2020-10-31 DIAGNOSIS — H40053 Ocular hypertension, bilateral: Secondary | ICD-10-CM | POA: Diagnosis not present

## 2020-10-31 DIAGNOSIS — H11153 Pinguecula, bilateral: Secondary | ICD-10-CM | POA: Diagnosis not present

## 2020-10-31 DIAGNOSIS — H04123 Dry eye syndrome of bilateral lacrimal glands: Secondary | ICD-10-CM | POA: Diagnosis not present

## 2020-10-31 DIAGNOSIS — H2513 Age-related nuclear cataract, bilateral: Secondary | ICD-10-CM | POA: Diagnosis not present

## 2020-11-01 MED FILL — NALTREXONE 50 MG TABLET: 50 | 90 days supply | Qty: 90 | Fill #3

## 2020-11-02 ENCOUNTER — Other Ambulatory Visit (HOSPITAL_COMMUNITY): Payer: Self-pay | Admitting: Specialist

## 2020-11-02 MED FILL — buPROPion HCL ER (XL) 150 M: 150 | 90 days supply | Qty: 270 | Fill #0

## 2020-11-02 MED FILL — DULOXETINE HCL 60 MG CPEP: 60 | 90 days supply | Qty: 90 | Fill #0

## 2020-11-09 ENCOUNTER — Other Ambulatory Visit (HOSPITAL_COMMUNITY): Payer: Self-pay | Admitting: Specialist

## 2020-11-10 MED FILL — D-AMPHETAMINE ER 15 MG CAP: 15 | 30 days supply | Qty: 120 | Fill #0

## 2020-11-21 ENCOUNTER — Ambulatory Visit: Payer: 59

## 2020-11-22 ENCOUNTER — Other Ambulatory Visit: Payer: 59

## 2020-11-28 ENCOUNTER — Encounter: Payer: 59 | Admitting: Family Medicine

## 2020-12-06 ENCOUNTER — Other Ambulatory Visit (HOSPITAL_COMMUNITY): Payer: Self-pay | Admitting: Osteopathic Medicine

## 2020-12-06 MED FILL — SIMBRINZA 1-0.2 % SUSP: 1-0.2 | 40 days supply | Qty: 8 | Fill #0

## 2020-12-10 ENCOUNTER — Other Ambulatory Visit: Payer: Self-pay | Admitting: Family Medicine

## 2020-12-10 DIAGNOSIS — E559 Vitamin D deficiency, unspecified: Secondary | ICD-10-CM

## 2020-12-10 DIAGNOSIS — E538 Deficiency of other specified B group vitamins: Secondary | ICD-10-CM

## 2020-12-10 DIAGNOSIS — Z9884 Bariatric surgery status: Secondary | ICD-10-CM

## 2020-12-10 DIAGNOSIS — I10 Essential (primary) hypertension: Secondary | ICD-10-CM

## 2020-12-10 DIAGNOSIS — D508 Other iron deficiency anemias: Secondary | ICD-10-CM

## 2020-12-12 ENCOUNTER — Other Ambulatory Visit: Payer: 59

## 2020-12-14 ENCOUNTER — Other Ambulatory Visit (INDEPENDENT_AMBULATORY_CARE_PROVIDER_SITE_OTHER): Payer: 59

## 2020-12-14 ENCOUNTER — Other Ambulatory Visit: Payer: Self-pay

## 2020-12-14 DIAGNOSIS — D508 Other iron deficiency anemias: Secondary | ICD-10-CM

## 2020-12-14 DIAGNOSIS — E538 Deficiency of other specified B group vitamins: Secondary | ICD-10-CM | POA: Diagnosis not present

## 2020-12-14 DIAGNOSIS — E559 Vitamin D deficiency, unspecified: Secondary | ICD-10-CM

## 2020-12-14 DIAGNOSIS — I10 Essential (primary) hypertension: Secondary | ICD-10-CM

## 2020-12-14 DIAGNOSIS — Z9884 Bariatric surgery status: Secondary | ICD-10-CM | POA: Diagnosis not present

## 2020-12-14 LAB — COMPREHENSIVE METABOLIC PANEL
ALT: 14 U/L (ref 0–35)
AST: 13 U/L (ref 0–37)
Albumin: 4.2 g/dL (ref 3.5–5.2)
Alkaline Phosphatase: 74 U/L (ref 39–117)
BUN: 20 mg/dL (ref 6–23)
CO2: 31 mEq/L (ref 19–32)
Calcium: 9.3 mg/dL (ref 8.4–10.5)
Chloride: 105 mEq/L (ref 96–112)
Creatinine, Ser: 0.92 mg/dL (ref 0.40–1.20)
GFR: 65.27 mL/min (ref >60.00)
Glucose, Bld: 85 mg/dL (ref 70–99)
Potassium: 4.5 mEq/L (ref 3.5–5.1)
Sodium: 141 mEq/L (ref 135–145)
Total Bilirubin: 0.5 mg/dL (ref 0.2–1.2)
Total Protein: 6.6 g/dL (ref 6.0–8.3)

## 2020-12-14 LAB — CBC WITH DIFFERENTIAL/PLATELET
Basophils Absolute: 0.1 10*3/uL (ref 0.0–0.1)
Basophils Relative: 1 % (ref 0.0–3.0)
Eosinophils Absolute: 0.1 10*3/uL (ref 0.0–0.7)
Eosinophils Relative: 1.9 % (ref 0.0–5.0)
HCT: 39.9 % (ref 36.0–46.0)
Hemoglobin: 13.1 g/dL (ref 12.0–15.0)
Lymphocytes Relative: 26.1 % (ref 12.0–46.0)
Lymphs Abs: 1.9 10*3/uL (ref 0.7–4.0)
MCHC: 32.9 g/dL (ref 30.0–36.0)
MCV: 89.2 fl (ref 78.0–100.0)
Monocytes Absolute: 0.6 10*3/uL (ref 0.1–1.0)
Monocytes Relative: 8 % (ref 3.0–12.0)
Neutro Abs: 4.6 10*3/uL (ref 1.4–7.7)
Neutrophils Relative %: 63 % (ref 43.0–77.0)
Platelets: 309 10*3/uL (ref 150.0–400.0)
RBC: 4.47 Mil/uL (ref 3.87–5.11)
RDW: 14.5 % (ref 11.5–15.5)
WBC: 7.3 10*3/uL (ref 4.0–10.5)

## 2020-12-14 LAB — IBC PANEL
Iron: 85 ug/dL (ref 42–145)
Saturation Ratios: 22.4 % (ref 20.0–50.0)
Transferrin: 271 mg/dL (ref 212.0–360.0)

## 2020-12-14 LAB — LIPID PANEL
Cholesterol: 178 mg/dL (ref 0–200)
HDL: 56.2 mg/dL (ref >39.00)
LDL Cholesterol: 107 mg/dL — ABNORMAL HIGH (ref 0–99)
NonHDL: 122.21
Total CHOL/HDL Ratio: 3
Triglycerides: 75 mg/dL (ref 0.0–149.0)
VLDL: 15 mg/dL (ref 0.0–40.0)

## 2020-12-14 LAB — VITAMIN D 25 HYDROXY (VIT D DEFICIENCY, FRACTURES): VITD: 28.09 ng/mL — ABNORMAL LOW (ref 30.00–100.00)

## 2020-12-14 LAB — TSH: TSH: 2.59 u[IU]/mL (ref 0.35–4.50)

## 2020-12-14 LAB — FERRITIN: Ferritin: 10.1 ng/mL (ref 10.0–291.0)

## 2020-12-14 LAB — FOLATE: Folate: 8.3 ng/mL (ref >5.9)

## 2020-12-14 LAB — VITAMIN B12: Vitamin B-12: 372 pg/mL (ref 211–911)

## 2020-12-14 NOTE — Addendum Note (Signed)
Addended by: Cloyd Stagers on: 12/14/2020 08:38 AM   Modules accepted: Orders

## 2020-12-19 ENCOUNTER — Ambulatory Visit (INDEPENDENT_AMBULATORY_CARE_PROVIDER_SITE_OTHER): Payer: 59 | Admitting: Family Medicine

## 2020-12-19 ENCOUNTER — Other Ambulatory Visit: Payer: Self-pay

## 2020-12-19 ENCOUNTER — Encounter: Payer: Self-pay | Admitting: Family Medicine

## 2020-12-19 ENCOUNTER — Other Ambulatory Visit: Payer: Self-pay | Admitting: Family Medicine

## 2020-12-19 VITALS — BP 132/72 | HR 101 | Temp 97.6°F | Ht 63.0 in | Wt 195.5 lb

## 2020-12-19 DIAGNOSIS — G44219 Episodic tension-type headache, not intractable: Secondary | ICD-10-CM

## 2020-12-19 DIAGNOSIS — G44209 Tension-type headache, unspecified, not intractable: Secondary | ICD-10-CM | POA: Insufficient documentation

## 2020-12-19 DIAGNOSIS — Z0000 Encounter for general adult medical examination without abnormal findings: Secondary | ICD-10-CM

## 2020-12-19 DIAGNOSIS — I10 Essential (primary) hypertension: Secondary | ICD-10-CM | POA: Diagnosis not present

## 2020-12-19 DIAGNOSIS — D508 Other iron deficiency anemias: Secondary | ICD-10-CM

## 2020-12-19 DIAGNOSIS — H409 Unspecified glaucoma: Secondary | ICD-10-CM | POA: Diagnosis not present

## 2020-12-19 DIAGNOSIS — E538 Deficiency of other specified B group vitamins: Secondary | ICD-10-CM

## 2020-12-19 DIAGNOSIS — E669 Obesity, unspecified: Secondary | ICD-10-CM

## 2020-12-19 DIAGNOSIS — Z23 Encounter for immunization: Secondary | ICD-10-CM | POA: Diagnosis not present

## 2020-12-19 DIAGNOSIS — E559 Vitamin D deficiency, unspecified: Secondary | ICD-10-CM

## 2020-12-19 DIAGNOSIS — E2839 Other primary ovarian failure: Secondary | ICD-10-CM | POA: Diagnosis not present

## 2020-12-19 DIAGNOSIS — Z9884 Bariatric surgery status: Secondary | ICD-10-CM | POA: Diagnosis not present

## 2020-12-19 DIAGNOSIS — J4520 Mild intermittent asthma, uncomplicated: Secondary | ICD-10-CM | POA: Diagnosis not present

## 2020-12-19 LAB — VITAMIN B1: Vitamin B1 (Thiamine): 11 nmol/L (ref 8–30)

## 2020-12-19 MED ORDER — METHOCARBAMOL 500 MG PO TABS: 500.0000 mg | ORAL_TABLET | Freq: Three times a day (TID) | ORAL | 0 refills | Status: DC | PRN

## 2020-12-19 MED ORDER — AMLODIPINE BESYLATE 10 MG PO TABS: 10.0000 mg | ORAL_TABLET | Freq: Every day | ORAL | 3 refills | Status: DC

## 2020-12-19 MED ORDER — ALBUTEROL SULFATE HFA 108 (90 BASE) MCG/ACT IN AERS: 2.0000 | INHALATION_SPRAY | Freq: Four times a day (QID) | RESPIRATORY_TRACT | 1 refills | Status: DC | PRN

## 2020-12-19 MED FILL — AMLODIPINE BESYLATE 10 MG T: 10 | 90 days supply | Qty: 90 | Fill #0

## 2020-12-19 MED FILL — ALBUTEROL SULFATE HFA 108 (: 108 (90 BAS | 25 days supply | Qty: 9 | Fill #0

## 2020-12-19 MED FILL — METHOCARBAMOL 500 MG TABLET: 500 | 10 days supply | Qty: 30 | Fill #0

## 2020-12-19 NOTE — Assessment & Plan Note (Addendum)
Weight loss noted over the past year, BMI now <35! - pt motivated to continue efforts.

## 2020-12-19 NOTE — Assessment & Plan Note (Signed)
Sees ophtho - on simbrinza

## 2020-12-19 NOTE — Assessment & Plan Note (Signed)
Suspect TTH - Rx robaxin muscle relaxant, rec heating pad and gentle stretching to neck.

## 2020-12-19 NOTE — Assessment & Plan Note (Signed)
Update labs.  

## 2020-12-19 NOTE — Assessment & Plan Note (Signed)
Preventative protocols reviewed and updated unless pt declined. Discussed healthy diet and lifestyle.  

## 2020-12-19 NOTE — Assessment & Plan Note (Signed)
Anemia has resolved, iron levels remain low - rec restart oral iron replacement.

## 2020-12-19 NOTE — Assessment & Plan Note (Signed)
Stable period without recent flares - albuterol refilled.

## 2020-12-19 NOTE — Assessment & Plan Note (Signed)
rec restart 548mcg daily or 1059mcg MWF

## 2020-12-19 NOTE — Patient Instructions (Addendum)
Pneumovax today  Call to schedule colonoscopy.  Touch base with GYN to see when you're due.  When you call to schedule mammogram, schedule bone density scan at the same time.  Check on last tetanus shot with employee health.  Try muscle relaxant robaxin for recent headache.  Restart vitamin D and iron. Restart vitamin B12 534mcg daily or 1077mcg MWF. Return as needed or in 1 year for next physical.   Health Maintenance After Age 66 After age 88, you are at a higher risk for certain long-term diseases and infections as well as injuries from falls. Falls are a major cause of broken bones and head injuries in people who are older than age 66. Getting regular preventive care can help to keep you healthy and well. Preventive care includes getting regular testing and making lifestyle changes as recommended by your health care provider. Talk with your health care provider about:  Which screenings and tests you should have. A screening is a test that checks for a disease when you have no symptoms.  A diet and exercise plan that is right for you. What should I know about screenings and tests to prevent falls? Screening and testing are the best ways to find a health problem early. Early diagnosis and treatment give you the best chance of managing medical conditions that are common after age 1. Certain conditions and lifestyle choices may make you more likely to have a fall. Your health care provider may recommend:  Regular vision checks. Poor vision and conditions such as cataracts can make you more likely to have a fall. If you wear glasses, make sure to get your prescription updated if your vision changes.  Medicine review. Work with your health care provider to regularly review all of the medicines you are taking, including over-the-counter medicines. Ask your health care provider about any side effects that may make you more likely to have a fall. Tell your health care provider if any medicines that  you take make you feel dizzy or sleepy.  Osteoporosis screening. Osteoporosis is a condition that causes the bones to get weaker. This can make the bones weak and cause them to break more easily.  Blood pressure screening. Blood pressure changes and medicines to control blood pressure can make you feel dizzy.  Strength and balance checks. Your health care provider may recommend certain tests to check your strength and balance while standing, walking, or changing positions.  Foot health exam. Foot pain and numbness, as well as not wearing proper footwear, can make you more likely to have a fall.  Depression screening. You may be more likely to have a fall if you have a fear of falling, feel emotionally low, or feel unable to do activities that you used to do.  Alcohol use screening. Using too much alcohol can affect your balance and may make you more likely to have a fall. What actions can I take to lower my risk of falls? General instructions  Talk with your health care provider about your risks for falling. Tell your health care provider if: ? You fall. Be sure to tell your health care provider about all falls, even ones that seem minor. ? You feel dizzy, sleepy, or off-balance.  Take over-the-counter and prescription medicines only as told by your health care provider. These include any supplements.  Eat a healthy diet and maintain a healthy weight. A healthy diet includes low-fat dairy products, low-fat (lean) meats, and fiber from whole grains, beans, and lots of  fruits and vegetables. Home safety  Remove any tripping hazards, such as rugs, cords, and clutter.  Install safety equipment such as grab bars in bathrooms and safety rails on stairs.  Keep rooms and walkways well-lit. Activity  Follow a regular exercise program to stay fit. This will help you maintain your balance. Ask your health care provider what types of exercise are appropriate for you.  If you need a cane or  walker, use it as recommended by your health care provider.  Wear supportive shoes that have nonskid soles.   Lifestyle  Do not drink alcohol if your health care provider tells you not to drink.  If you drink alcohol, limit how much you have: ? 0-1 drink a day for women. ? 0-2 drinks a day for men.  Be aware of how much alcohol is in your drink. In the [place] one drink equals one typical bottle of beer (12 oz), one-half glass of wine (5 oz), or one shot of hard liquor (1 oz).  Do not use any products that contain nicotine or tobacco, such as cigarettes and e-cigarettes. If you need help quitting, ask your health care provider. Summary  Having a healthy lifestyle and getting preventive care can help to protect your health and wellness after age 72.  Screening and testing are the best way to find a health problem early and help you avoid having a fall. Early diagnosis and treatment give you the best chance for managing medical conditions that are more common for people who are older than age 48.  Falls are a major cause of broken bones and head injuries in people who are older than age 31. Take precautions to prevent a fall at home.  Work with your health care provider to learn what changes you can make to improve your health and wellness and to prevent falls. This information is not intended to replace advice given to you by your health care provider. Make sure you discuss any questions you have with your health care provider. Document Revised: 03/04/2019 Document Reviewed: 09/24/2017 Elsevier Patient Education  2021 [employer].

## 2020-12-19 NOTE — Assessment & Plan Note (Signed)
rec restart 1000 IU daily.

## 2020-12-19 NOTE — Progress Notes (Signed)
Patient ID: Laura Mccarthy, female    DOB: 09/19/1956, 65 y.o.   MRN: [id number]  This visit was conducted in person.  BP 132/72 (BP Location: Left Arm, Patient Position: Sitting, Cuff Size: Large)   Pulse (!) 101   Temp 97.6 F (36.4 C) (Temporal)   Ht 5\' 3"  (1.6 m)   Wt 195 lb 8 oz (88.7 kg)   SpO2 98%   BMI 34.63 kg/m    CC: CPE Subjective:   HPI: Laura Mccarthy is a 65 y.o. female presenting on 12/19/2020 for Annual Exam   Notes ongoing headache for 1+ wk posterior neck into base of skull. Manages with ibuprofen.   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 Needs to call to schedule.  Well woman with OBGYN Dr Trenton Gammon seen 2019 Breast cancer screening - Birads1 06/2020  DEXA scan - due Lung cancer screening - not eligible Flu shot - yearly  [place] 11/2019 x2, booster 08/2020 Tetanus shot - will check at home Pneumovax - today  Shingrix - 10/2019, 01/2020 Advanced directive discussion -  Seat belt use discussed Sunscreen use discussed. No changing moles on skin. 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 Interim medical history since our last visit reviewed. Allergies and medications reviewed and updated. Outpatient Medications Prior to Visit  Medication Sig Dispense Refill  . acetaminophen (TYLENOL) 500 MG tablet Take 1,000 mg by mouth every 6 (six) hours as needed for mild pain or moderate pain.    Marland Kitchen amphetamine-dextroamphetamine (ADDERALL XR) 15 MG 24 hr capsule Take 2 capsules by mouth every morning.  0  . buPROPion (WELLBUTRIN XL) 150 MG 24 hr tablet Take 3 tablets (450 mg total) by mouth daily.    . Cholecalciferol (VITAMIN D3) 25 MCG (1000 UT) CAPS Take 1  capsule (1,000 Units total) by mouth daily. 30 capsule   . Cyanocobalamin (B-12) 1000 MCG SUBL Place 1 tablet under the tongue every Monday, Wednesday, and Friday.    . DULoxetine (CYMBALTA) 60 MG capsule Take 60 mg by mouth every morning.    . ferrous sulfate 325 (65 FE) MG EC tablet Take 1 tablet (325 mg total) by mouth every other day.    . Multiple Vitamins-Minerals (MULTIVITAL PO) Take by mouth.    Marland Kitchen SIMBRINZA 1-0.2 % SUSP INSTILL 1 DROP IN BOTH EYES TWICE A DAY    . albuterol (PROVENTIL HFA;VENTOLIN HFA) 108 (90 Base) MCG/ACT inhaler Inhale 2 puffs into the lungs every 6 (six) hours as needed for wheezing or shortness of breath. 1 Inhaler 1  . amLODipine (NORVASC) 10 MG tablet TAKE 1 TABLET BY MOUTH DAILY 90 tablet 0  . brimonidine (ALPHAGAN) 0.2 % ophthalmic solution   0   No facility-administered medications prior to visit.     Per HPI unless specifically indicated in ROS section below Review of Systems  Constitutional: Negative for activity change, appetite change, chills, fatigue, fever and unexpected weight change.  HENT: Negative for hearing loss.   Eyes: Negative for visual disturbance.  Respiratory: Negative for cough, chest tightness, shortness of breath and wheezing.   Cardiovascular: Negative for chest pain, palpitations and leg swelling.  Gastrointestinal: Negative for abdominal distention, abdominal pain, blood in stool,  constipation, diarrhea, nausea and vomiting.  Genitourinary: Negative for difficulty urinating and hematuria.  Musculoskeletal: Negative for arthralgias, myalgias and neck pain.  Skin: Negative for rash.  Neurological: Positive for headaches (posterior neck). Negative for dizziness, seizures and syncope.  Hematological: Negative for adenopathy. Does not bruise/bleed easily.  Psychiatric/Behavioral: Negative for dysphoric mood. The patient is not nervous/anxious.    Objective:  BP 132/72 (BP Location: Left Arm, Patient Position: Sitting, Cuff Size:  Large)   Pulse (!) 101   Temp 97.6 F (36.4 C) (Temporal)   Ht 5\' 3"  (1.6 m)   Wt 195 lb 8 oz (88.7 kg)   SpO2 98%   BMI 34.63 kg/m   Wt Readings from Last 3 Encounters:  12/19/20 195 lb 8 oz (88.7 kg)  05/16/20 200 lb 6 oz (90.9 kg)  02/08/20 206 lb 9 oz (93.7 kg)      Physical Exam Vitals and nursing note reviewed.  Constitutional:      General: She is not in acute distress.    Appearance: Normal appearance. She is well-developed and well-nourished. She is not ill-appearing.  HENT:     Head: Normocephalic and atraumatic.     Right Ear: Hearing, tympanic membrane, ear canal and external ear normal.     Left Ear: Hearing, tympanic membrane, ear canal and external ear normal.     Mouth/Throat:     Mouth: Oropharynx is clear and moist and mucous membranes are normal.     Pharynx: No posterior oropharyngeal edema.  Eyes:     General: No scleral icterus.    Extraocular Movements: Extraocular movements intact and EOM normal.     Conjunctiva/sclera: Conjunctivae normal.     Pupils: Pupils are equal, round, and reactive to light.  Neck:     Thyroid: No thyroid mass or thyromegaly.     Comments:  FROM at neck No midline cervical spine tenderness Cardiovascular:     Rate and Rhythm: Normal rate and regular rhythm.     Pulses: Normal pulses and intact distal pulses.          Radial pulses are 2+ on the right side and 2+ on the left side.     Heart sounds: Normal heart sounds. No murmur heard.   Pulmonary:     Effort: Pulmonary effort is normal. No respiratory distress.     Breath sounds: Normal breath sounds. No wheezing, rhonchi or rales.  Abdominal:     General: Abdomen is flat. Bowel sounds are normal. There is no distension.     Palpations: Abdomen is soft. There is no mass.     Tenderness: There is no abdominal tenderness. There is no guarding or rebound.     Hernia: No hernia is present.  Musculoskeletal:        General: No edema. Normal range of motion.     Cervical  back: Normal range of motion and neck supple.     Right lower leg: No edema.     Left lower leg: No edema.  Lymphadenopathy:     Cervical: No cervical adenopathy.  Skin:    General: Skin is warm and dry.     Findings: No rash.  Neurological:     General: No focal deficit present.     Mental Status: She is alert and oriented to person, place, and time.     Comments: CN grossly intact, station and gait intact  Psychiatric:        Mood and Affect: Mood and affect and mood  normal.        Behavior: Behavior normal.        Thought Content: Thought content normal.        Judgment: Judgment normal.       Results for orders placed or performed in visit on 12/14/20  Vitamin B1  Result Value Ref Range   Vitamin B1 (Thiamine) 11 8 - 30 nmol/L  VITAMIN D 25 Hydroxy (Vit-D Deficiency, Fractures)  Result Value Ref Range   VITD 28.09 (L) 30.00 - 100.00 ng/mL  IBC panel  Result Value Ref Range   Iron 85 42 - 145 ug/dL   Transferrin 271.0 212.0 - 360.0 mg/dL   Saturation Ratios 22.4 20.0 - 50.0 %  Folate  Result Value Ref Range   Folate 8.3 >5.9 ng/mL  Ferritin  Result Value Ref Range   Ferritin 10.1 10.0 - 291.0 ng/mL  Vitamin B12  Result Value Ref Range   Vitamin B-12 372 211 - 911 pg/mL  CBC with Differential/Platelet  Result Value Ref Range   WBC 7.3 4.0 - 10.5 K/uL   RBC 4.47 3.87 - 5.11 Mil/uL   Hemoglobin 13.1 12.0 - 15.0 g/dL   HCT 39.9 36.0 - 46.0 %   MCV 89.2 78.0 - 100.0 fl   MCHC 32.9 30.0 - 36.0 g/dL   RDW 14.5 11.5 - 15.5 %   Platelets 309.0 150.0 - 400.0 K/uL   Neutrophils Relative % 63.0 43.0 - 77.0 %   Lymphocytes Relative 26.1 12.0 - 46.0 %   Monocytes Relative 8.0 3.0 - 12.0 %   Eosinophils Relative 1.9 0.0 - 5.0 %   Basophils Relative 1.0 0.0 - 3.0 %   Neutro Abs 4.6 1.4 - 7.7 K/uL   Lymphs Abs 1.9 0.7 - 4.0 K/uL   Monocytes Absolute 0.6 0.1 - 1.0 K/uL   Eosinophils Absolute 0.1 0.0 - 0.7 K/uL   Basophils Absolute 0.1 0.0 - 0.1 K/uL  TSH  Result Value  Ref Range   TSH 2.59 0.35 - 4.50 uIU/mL  Comprehensive metabolic panel  Result Value Ref Range   Sodium 141 135 - 145 mEq/L   Potassium 4.5 3.5 - 5.1 mEq/L   Chloride 105 96 - 112 mEq/L   CO2 31 19 - 32 mEq/L   Glucose, Bld 85 70 - 99 mg/dL   BUN 20 6 - 23 mg/dL   Creatinine, Ser 0.92 0.40 - 1.20 mg/dL   Total Bilirubin 0.5 0.2 - 1.2 mg/dL   Alkaline Phosphatase 74 39 - 117 U/L   AST 13 0 - 37 U/L   ALT 14 0 - 35 U/L   Total Protein 6.6 6.0 - 8.3 g/dL   Albumin 4.2 3.5 - 5.2 g/dL   GFR 65.27 >60.00 mL/min   Calcium 9.3 8.4 - 10.5 mg/dL  Lipid panel  Result Value Ref Range   Cholesterol 178 0 - 200 mg/dL   Triglycerides 75.0 0.0 - 149.0 mg/dL   HDL 56.20 >39.00 mg/dL   VLDL 15.0 0.0 - 40.0 mg/dL   LDL Cholesterol 107 (H) 0 - 99 mg/dL   Total CHOL/HDL Ratio 3    NonHDL 122.21    Assessment & Plan:  This visit occurred during the SARS-CoV-2 public health emergency.  Safety protocols were in place, including screening questions prior to the visit, additional usage of staff PPE, and extensive cleaning of exam room while observing appropriate contact time as indicated for disinfecting solutions.   Problem List Items Addressed This Visit    Vitamin  D deficiency    rec restart 1000 IU daily.       Vitamin B12 deficiency    rec restart 534mcg daily or 1067mcg MWF      TTH (tension-type headache)    Suspect TTH - Rx robaxin muscle relaxant, rec heating pad and gentle stretching to neck.       Relevant Medications   methocarbamol (ROBAXIN) 500 MG tablet   amLODipine (NORVASC) 10 MG tablet   S/P laparoscopic sleeve gastrectomy    Update labs.       Obesity, Class I, BMI 30-34.9    Weight loss noted over the past year, BMI now <35! - pt motivated to continue efforts.       Iron deficiency anemia    Anemia has resolved, iron levels remain low - rec restart oral iron replacement.       Health maintenance examination - Primary    Preventative protocols reviewed and  updated unless pt declined. Discussed healthy diet and lifestyle.       Glaucoma    Sees ophtho - on simbrinza      Relevant Medications   SIMBRINZA 1-0.2 % SUSP   Essential hypertension    Chronic, stable on amlodipine - continue.       Relevant Medications   amLODipine (NORVASC) 10 MG tablet   Asthma    Stable period without recent flares - albuterol refilled.      Relevant Medications   albuterol (VENTOLIN HFA) 108 (90 Base) MCG/ACT inhaler    Other Visit Diagnoses    Need for 23-polyvalent pneumococcal polysaccharide vaccine       Relevant Orders   Pneumococcal polysaccharide vaccine 23-valent greater than or equal to 2yo subcutaneous/IM (Completed)   Estrogen deficiency       Relevant Orders   DG Bone Density       Meds ordered this encounter  Medications  . methocarbamol (ROBAXIN) 500 MG tablet    Sig: Take 1 tablet (500 mg total) by mouth 3 (three) times daily as needed for muscle spasms.    Dispense:  30 tablet    Refill:  0  . albuterol (VENTOLIN HFA) 108 (90 Base) MCG/ACT inhaler    Sig: Inhale 2 puffs into the lungs every 6 (six) hours as needed for wheezing or shortness of breath.    Dispense:  8 g    Refill:  1    Please fill inhaler brand that is covered by patient's insurance. Thank you  . amLODipine (NORVASC) 10 MG tablet    Sig: Take 1 tablet (10 mg total) by mouth daily.    Dispense:  90 tablet    Refill:  3   Orders Placed This Encounter  Procedures  . DG Bone Density    Standing Status:   Future    Standing Expiration Date:   12/19/2021    Order Specific Question:   Reason for Exam (SYMPTOM  OR DIAGNOSIS REQUIRED)    Answer:   OP screening    Order Specific Question:   Preferred imaging location?    Answer:   [facility]  . Pneumococcal polysaccharide vaccine 23-valent greater than or equal to 2yo subcutaneous/IM    Patient instructions: Pneumovax today  Call to schedule colonoscopy.  Touch base with GYN to see when you're due.   When you call to schedule mammogram, schedule bone density scan at the same time.  Check on last tetanus shot with employee health.  Try muscle relaxant robaxin for recent headache.  Restart vitamin D and iron. Restart vitamin B12 565mcg daily or 1028mcg MWF. Return as needed or in 1 year for next physical.   Follow up plan: Return in about 1 year (around 12/19/2021), or if symptoms worsen or fail to improve, for annual exam, prior fasting for blood work.  Ria Bush, MD

## 2020-12-19 NOTE — Assessment & Plan Note (Signed)
Chronic, stable on amlodipine - continue.  

## 2020-12-20 LAB — MICROALBUMIN / CREATININE URINE RATIO
Creatinine,U: 214.2 mg/dL
Microalb Creat Ratio: 1.8 mg/g (ref 0.0–30.0)
Microalb, Ur: 3.9 mg/dL — ABNORMAL HIGH (ref 0.0–1.9)

## 2020-12-25 ENCOUNTER — Other Ambulatory Visit (HOSPITAL_COMMUNITY): Payer: Self-pay | Admitting: Specialist

## 2020-12-25 MED FILL — D-AMPHETAMINE ER 15 MG CAP: 15 | 90 days supply | Qty: 360 | Fill #0

## 2020-12-27 ENCOUNTER — Other Ambulatory Visit: Payer: Self-pay | Admitting: Family Medicine

## 2020-12-27 DIAGNOSIS — Z1231 Encounter for screening mammogram for malignant neoplasm of breast: Secondary | ICD-10-CM

## 2021-02-15 ENCOUNTER — Other Ambulatory Visit (HOSPITAL_BASED_OUTPATIENT_CLINIC_OR_DEPARTMENT_OTHER): Payer: Self-pay

## 2021-02-15 MED FILL — buPROPion HCL ER (XL) 150 M: 150 | 90 days supply | Qty: 270 | Fill #0

## 2021-02-15 MED FILL — DULOXETINE HCL 60 MG CPEP: 60 | 90 days supply | Qty: 90 | Fill #1

## 2021-03-01 ENCOUNTER — Other Ambulatory Visit (HOSPITAL_COMMUNITY): Payer: Self-pay

## 2021-03-01 MED ORDER — SIMBRINZA 1-0.2 % OP SUSP
OPHTHALMIC | 0 refills | Status: DC
  Filled 2021-03-01: qty 8, 80d supply, fill #0
  Filled 2021-03-14: qty 8, 40d supply, fill #0

## 2021-03-05 ENCOUNTER — Other Ambulatory Visit (HOSPITAL_COMMUNITY): Payer: Self-pay

## 2021-03-06 ENCOUNTER — Other Ambulatory Visit (HOSPITAL_COMMUNITY): Payer: Self-pay

## 2021-03-14 ENCOUNTER — Other Ambulatory Visit (HOSPITAL_COMMUNITY): Payer: Self-pay

## 2021-05-03 ENCOUNTER — Encounter: Payer: Self-pay | Admitting: Internal Medicine

## 2021-05-09 ENCOUNTER — Other Ambulatory Visit (HOSPITAL_COMMUNITY): Payer: Self-pay

## 2021-05-09 MED FILL — Amlodipine Besylate Tab 10 MG (Base Equivalent): ORAL | 90 days supply | Qty: 90 | Fill #0 | Status: AC

## 2021-05-12 DIAGNOSIS — T1512XA Foreign body in conjunctival sac, left eye, initial encounter: Secondary | ICD-10-CM | POA: Diagnosis not present

## 2021-05-12 DIAGNOSIS — Q148 Other congenital malformations of posterior segment of eye: Secondary | ICD-10-CM | POA: Diagnosis not present

## 2021-05-18 DIAGNOSIS — H524 Presbyopia: Secondary | ICD-10-CM | POA: Diagnosis not present

## 2021-05-18 DIAGNOSIS — H40023 Open angle with borderline findings, high risk, bilateral: Secondary | ICD-10-CM | POA: Diagnosis not present

## 2021-05-18 DIAGNOSIS — H40053 Ocular hypertension, bilateral: Secondary | ICD-10-CM | POA: Diagnosis not present

## 2021-05-18 DIAGNOSIS — H52223 Regular astigmatism, bilateral: Secondary | ICD-10-CM | POA: Diagnosis not present

## 2021-05-18 DIAGNOSIS — H2513 Age-related nuclear cataract, bilateral: Secondary | ICD-10-CM | POA: Diagnosis not present

## 2021-05-21 DIAGNOSIS — L738 Other specified follicular disorders: Secondary | ICD-10-CM | POA: Diagnosis not present

## 2021-05-21 DIAGNOSIS — Z85828 Personal history of other malignant neoplasm of skin: Secondary | ICD-10-CM | POA: Diagnosis not present

## 2021-05-23 ENCOUNTER — Other Ambulatory Visit: Payer: Self-pay

## 2021-05-23 ENCOUNTER — Other Ambulatory Visit (HOSPITAL_COMMUNITY): Payer: Self-pay

## 2021-05-23 MED ORDER — DULOXETINE HCL 60 MG PO CPEP
60.0000 mg | ORAL_CAPSULE | Freq: Every day | ORAL | 1 refills | Status: DC
  Filled 2021-05-23: qty 90, 90d supply, fill #0

## 2021-05-23 MED FILL — Bupropion HCl Tab ER 24HR 150 MG: ORAL | 90 days supply | Qty: 270 | Fill #0 | Status: AC

## 2021-05-24 ENCOUNTER — Other Ambulatory Visit (HOSPITAL_COMMUNITY): Payer: Self-pay

## 2021-05-30 ENCOUNTER — Other Ambulatory Visit (HOSPITAL_COMMUNITY): Payer: Self-pay

## 2021-05-30 MED ORDER — XIIDRA 5 % OP SOLN
OPHTHALMIC | 0 refills | Status: DC
  Filled 2021-05-30: qty 60, 30d supply, fill #0

## 2021-06-11 ENCOUNTER — Ambulatory Visit (INDEPENDENT_AMBULATORY_CARE_PROVIDER_SITE_OTHER): Payer: 59 | Admitting: Obstetrics and Gynecology

## 2021-06-11 ENCOUNTER — Other Ambulatory Visit: Payer: Self-pay

## 2021-06-11 ENCOUNTER — Encounter: Payer: Self-pay | Admitting: Obstetrics and Gynecology

## 2021-06-11 VITALS — BP 134/76 | HR 101 | Ht 62.75 in | Wt 196.4 lb

## 2021-06-11 DIAGNOSIS — Z01419 Encounter for gynecological examination (general) (routine) without abnormal findings: Secondary | ICD-10-CM

## 2021-06-11 NOTE — Patient Instructions (Signed)

## 2021-06-11 NOTE — Progress Notes (Signed)
65 y.o. G0P0000 Married White or Caucasian Not Hispanic or Latino female here for annual exam. No vaginal bleeding. No bowel or bladder c/o. No dyspareunia.    Gastric sleeve in 2015, got down to 178. Has gained about 30 lbs. High weight was 249.   No LMP recorded. Patient is postmenopausal.          Sexually active: Yes.    The current method of family planning is post menopausal status.    Exercising: No.  The patient does not participate in regular exercise at present. Smoker:  no  Health Maintenance: Pap:  05/13/18 WNL Hr HPV Neg History of abnormal Pap:  no MMG:  07/13/20 Density B Bi-rads 1 Neg  BMD:   None  Colonoscopy: 12/19/15 F/U 3 years, scheduled for 9/22.  TDaP:  up to date  Gardasil: NA   reports that she has never smoked. She has never used smokeless tobacco. She reports current alcohol use. She reports that she does not use drugs. Just occasional ETOH. 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 She has been working there for 27 years, retiring in 9/22. Husband is retired.  She has 4 step-children, 2 grandchildren in [place] (7 & 9).   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     albuterol (VENTOLIN HFA) 108 (90 Base) MCG/ACT inhaler INHALE 2 PUFFS INTO THE LUNGS EVERY 6 HOURS AS NEEDED FOR WHEEZING OR SHORTNESS OF BREATH. 8.5 g 1   amLODipine (NORVASC) 10 MG tablet TAKE 1 TABLET BY MOUTH DAILY. 90 tablet 3   amphetamine-dextroamphetamine (ADDERALL XR) 15 MG 24 hr capsule Take 2 capsules by mouth every morning.  0   Brinzolamide-Brimonidine (SIMBRINZA) 1-0.2 % SUSP Instill 1 drop in both eyes twice a day 24 mL 0   buPROPion (WELLBUTRIN XL) 150 MG 24 hr tablet Take 3 tablets (450 mg total) by mouth daily.     Cholecalciferol (VITAMIN D3) 25 MCG (1000 UT) CAPS Take 1 capsule (1,000 Units total) by mouth daily. 30 capsule    Cyanocobalamin (B-12) 1000 MCG SUBL Place 1 tablet under the tongue every Monday, Wednesday, and Friday.     dextroamphetamine (DEXEDRINE SPANSULE) 15 MG 24 hr capsule TAKE 2 CAPSULES BY MOUTH EVERY MORNING AND 2 CAPSULES AT NOON 360 capsule 0   DULoxetine (CYMBALTA) 60 MG capsule Take 60 mg by mouth every morning.  ferrous sulfate 325 (65 FE) MG EC tablet Take 1 tablet (325 mg total) by mouth every other day.     methocarbamol (ROBAXIN) 500 MG tablet TAKE 1 TABLET BY MOUTH 3 TIMES DAILY AS NEEDED FOR MUSCLE SPASM 30 tablet 0   Multiple Vitamins-Minerals (MULTIVITAL PO) Take by mouth.     SIMBRINZA 1-0.2 % SUSP INSTILL 1 DROP IN BOTH EYES TWICE A DAY     dextroamphetamine (DEXEDRINE SPANSULE) 15 MG 24 hr capsule TAKE 2 CAPSULES BY MOUTH IN MORNING AND 2 CAPSULES AT NOON 120 capsule 0   No current facility-administered medications for this visit.    Family History  Problem Relation Age of Onset   Heart disease Mother    Arthritis Mother    Heart attack Mother    High blood pressure Mother    Heart disease Father    Alcohol abuse Father    COPD Father    Heart attack Father     High blood pressure Father    Diabetes Sister    Hypertension Sister    Arthritis Sister    Heart disease Sister    Diabetes Sister    Hypertension Sister    Asthma Sister    COPD Sister    Heart disease Sister    High Cholesterol Sister    Melanoma Sister    Heart attack Maternal Grandfather    Diabetes Paternal Grandmother    Colon cancer Neg Hx    Colon polyps Neg Hx    Esophageal cancer Neg Hx    Rectal cancer Neg Hx    Stomach cancer Neg Hx     Review of Systems  All other systems reviewed and are negative.  Exam:   BP 134/76   Pulse (!) 101   Ht 5' 2.75" (1.594 m)   Wt 196 lb 6.4 oz (89.1 kg)   SpO2 98%   BMI 35.07 kg/m   Weight change: @WEIGHTCHANGE @ Height:   Height: 5' 2.75" (159.4 cm)  Ht Readings from Last 3 Encounters:  06/11/21 5' 2.75" (1.594 m)  12/19/20 5\' 3"  (1.6 m)  05/16/20 5\' 3"  (1.6 m)    General appearance: alert, cooperative and appears stated age Head: Normocephalic, without obvious abnormality, atraumatic Neck: no adenopathy, supple, symmetrical, trachea midline and thyroid normal to inspection and palpation Lungs: clear to auscultation bilaterally Cardiovascular: regular rate and rhythm Breasts: normal appearance, no masses or tenderness Abdomen: soft, non-tender; non distended,  no masses,  no organomegaly Extremities: extremities normal, atraumatic, no cyanosis or edema Skin: Skin color, texture, turgor normal. No rashes or lesions Lymph nodes: Cervical, supraclavicular, and axillary nodes normal. No abnormal inguinal nodes palpated Neurologic: Grossly normal   Pelvic: External genitalia:  no lesions              Urethra:  normal appearing urethra with no masses, tenderness or lesions              Bartholins and Skenes: normal                 Vagina: mildly atrophic appearing vagina with normal color and discharge, no lesions              Cervix: no lesions               Bimanual Exam:  Uterus:   no masses or tenderness               Adnexa: no mass, fullness, tenderness  Rectovaginal: Confirms               Anus:  normal sphincter tone, no lesions  Gae Dry chaperoned for the exam.  1. Well woman exam No pap this year Mammogram next month Colonoscopy in 9/22 Discussed breast self exam Discussed calcium and vit D intake  Labs with primary

## 2021-07-03 ENCOUNTER — Other Ambulatory Visit (HOSPITAL_COMMUNITY): Payer: Self-pay

## 2021-07-13 ENCOUNTER — Other Ambulatory Visit (HOSPITAL_COMMUNITY): Payer: Self-pay

## 2021-07-13 MED ORDER — DEXTROAMPHETAMINE SULFATE ER 15 MG PO CP24
ORAL_CAPSULE | ORAL | 0 refills | Status: DC
  Filled 2021-07-13: qty 360, 90d supply, fill #0

## 2021-07-14 ENCOUNTER — Other Ambulatory Visit (HOSPITAL_COMMUNITY): Payer: Self-pay

## 2021-07-16 ENCOUNTER — Other Ambulatory Visit (HOSPITAL_COMMUNITY): Payer: Self-pay

## 2021-07-17 ENCOUNTER — Other Ambulatory Visit (HOSPITAL_COMMUNITY): Payer: Self-pay

## 2021-07-17 ENCOUNTER — Ambulatory Visit
Admission: RE | Admit: 2021-07-17 | Discharge: 2021-07-17 | Disposition: A | Discharge status: Home / Self Care | Payer: 59 | Source: Ambulatory Visit | Attending: Family Medicine | Admitting: Family Medicine

## 2021-07-17 ENCOUNTER — Other Ambulatory Visit: Payer: Self-pay

## 2021-07-17 DIAGNOSIS — Z1231 Encounter for screening mammogram for malignant neoplasm of breast: Secondary | ICD-10-CM | POA: Diagnosis not present

## 2021-07-17 DIAGNOSIS — E2839 Other primary ovarian failure: Secondary | ICD-10-CM

## 2021-07-17 DIAGNOSIS — M85851 Other specified disorders of bone density and structure, right thigh: Secondary | ICD-10-CM | POA: Diagnosis not present

## 2021-07-17 DIAGNOSIS — Z780 Asymptomatic menopausal state: Secondary | ICD-10-CM | POA: Diagnosis not present

## 2021-07-19 ENCOUNTER — Encounter: Payer: Self-pay | Admitting: Family Medicine

## 2021-07-19 ENCOUNTER — Other Ambulatory Visit (HOSPITAL_COMMUNITY): Payer: Self-pay

## 2021-07-19 DIAGNOSIS — M8580 Other specified disorders of bone density and structure, unspecified site: Secondary | ICD-10-CM | POA: Insufficient documentation

## 2021-07-19 MED ORDER — NALTREXONE HCL 50 MG PO TABS
ORAL_TABLET | ORAL | 0 refills | Status: DC
  Filled 2021-07-19: qty 90, 90d supply, fill #0

## 2021-07-25 ENCOUNTER — Other Ambulatory Visit: Payer: Self-pay

## 2021-07-25 ENCOUNTER — Ambulatory Visit (AMBULATORY_SURGERY_CENTER): Payer: 59

## 2021-07-25 ENCOUNTER — Other Ambulatory Visit (HOSPITAL_COMMUNITY): Payer: Self-pay

## 2021-07-25 VITALS — Ht 63.0 in | Wt 195.0 lb

## 2021-07-25 DIAGNOSIS — Z86010 Personal history of colonic polyps: Secondary | ICD-10-CM

## 2021-07-25 MED ORDER — NA SULFATE-K SULFATE-MG SULF 17.5-3.13-1.6 GM/177ML PO SOLN
1.0000 | Freq: Once | ORAL | 0 refills | Status: AC
  Filled 2021-07-25: qty 354, 1d supply, fill #0

## 2021-07-25 NOTE — Progress Notes (Signed)

## 2021-07-26 HISTORY — PX: COLONOSCOPY: SHX174

## 2021-08-08 ENCOUNTER — Encounter: Payer: Self-pay | Admitting: Internal Medicine

## 2021-08-08 ENCOUNTER — Other Ambulatory Visit: Payer: Self-pay

## 2021-08-08 ENCOUNTER — Ambulatory Visit (AMBULATORY_SURGERY_CENTER): Payer: 59 | Admitting: Internal Medicine

## 2021-08-08 ENCOUNTER — Telehealth: Payer: Self-pay | Admitting: Internal Medicine

## 2021-08-08 VITALS — BP 131/78 | HR 73 | Temp 98.6°F | Resp 16 | Ht 63.0 in | Wt 195.0 lb

## 2021-08-08 DIAGNOSIS — Z86010 Personal history of colonic polyps: Secondary | ICD-10-CM | POA: Diagnosis not present

## 2021-08-08 DIAGNOSIS — Z1211 Encounter for screening for malignant neoplasm of colon: Secondary | ICD-10-CM | POA: Diagnosis not present

## 2021-08-08 DIAGNOSIS — D124 Benign neoplasm of descending colon: Secondary | ICD-10-CM | POA: Diagnosis not present

## 2021-08-08 DIAGNOSIS — D122 Benign neoplasm of ascending colon: Secondary | ICD-10-CM | POA: Diagnosis not present

## 2021-08-08 DIAGNOSIS — D123 Benign neoplasm of transverse colon: Secondary | ICD-10-CM | POA: Diagnosis not present

## 2021-08-08 MED ORDER — FLEET ENEMA 7-19 GM/118ML RE ENEM
1.0000 | ENEMA | Freq: Once | RECTAL | Status: AC
  Administered 2021-08-08: 1 via RECTAL

## 2021-08-08 MED ORDER — SODIUM CHLORIDE 0.9 % IV SOLN: 500.0000 mL | INTRAVENOUS | Status: DC

## 2021-08-08 NOTE — Progress Notes (Signed)
Sedate, gd SR, tolerated procedure well, VSS, report to RN 

## 2021-08-08 NOTE — Progress Notes (Signed)
Called to room to assist during endoscopic procedure.  Patient ID and intended procedure confirmed with present staff. Received instructions for my participation in the procedure from the performing physician.  

## 2021-08-08 NOTE — Progress Notes (Signed)
HISTORY OF PRESENT ILLNESS:  Laura Mccarthy is a 65 y.o. female who presents today for surveillance colonoscopy due to history of multiple adenomatous colon polyps and advanced adenoma.  Last examination 2017.  No active complaints.  Stable chronic medical problems.  Had prep problems  REVIEW OF SYSTEMS:  All non-GI ROS negative.  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 She has never used smokeless tobacco. She reports current alcohol use of about 2.0 standard drinks per week. She reports that she does not use drugs.  family history includes Alcohol abuse in her father; Arthritis in her mother and sister; Asthma in her sister; COPD in her father and sister; Colon polyps in her sister; Diabetes in her paternal grandmother, sister, and sister; Heart attack in her father, maternal grandfather, and mother; Heart disease in her father, mother, sister, and sister; High Cholesterol in her sister; High blood pressure in her father and  mother; Hypertension in her sister and sister; Melanoma in her sister.  No Known Allergies     PHYSICAL EXAMINATION:  Vital signs: BP 135/71   Pulse 92   Temp 98.6 F (37 C)   Ht _0  (1.6 m)   Wt 195 lb (88.5 kg)   SpO2 98%   BMI 34.54 kg/m  General: Well-developed, well-nourished, no acute distress HEENT: Sclerae are anicteric, conjunctiva pink. Oral mucosa intact Lungs: Clear Heart: Regular Abdomen: soft, nontender, nondistended, no obvious ascites, no peritoneal signs, normal bowel sounds. No organomegaly. Extremities: No edema Psychiatric: alert and oriented x3. Cooperative     ASSESSMENT:  1.  History of multiple and advanced adenomatous polyps.  Due for follow-up   PLAN:  1.  Colonoscopy

## 2021-08-08 NOTE — Telephone Encounter (Signed)
Vomited a little last night, some more w/ second dose  Stools "not clear" but they are liquid  Advised to continue with planned appointment today and will make further prep recommendations if any after admission to [facility]

## 2021-08-08 NOTE — Patient Instructions (Signed)
Please read handouts provided. Continue present medications. Await pathology results.  Repeat colonoscopy in 3 years for screening.   YOU HAD AN ENDOSCOPIC PROCEDURE TODAY AT THE [facility]:   Refer to the procedure report that was given to you for any specific questions about what was found during the examination.  If the procedure report does not answer your questions, please call your gastroenterologist to clarify.  If you requested that your care partner not be given the details of your procedure findings, then the procedure report has been included in a sealed envelope for you to review at your convenience later.  YOU SHOULD EXPECT: Some feelings of bloating in the abdomen. Passage of more gas than usual.  Walking can help get rid of the air that was put into your GI tract during the procedure and reduce the bloating. If you had a lower endoscopy (such as a colonoscopy or flexible sigmoidoscopy) you may notice spotting of blood in your stool or on the toilet paper. If you underwent a bowel prep for your procedure, you may not have a normal bowel movement for a few days.  Please Note:  You might notice some irritation and congestion in your nose or some drainage.  This is from the oxygen used during your procedure.  There is no need for concern and it should clear up in a day or so.  SYMPTOMS TO REPORT IMMEDIATELY:  Following lower endoscopy (colonoscopy or flexible sigmoidoscopy):  Excessive amounts of blood in the stool  Significant tenderness or worsening of abdominal pains  Swelling of the abdomen that is new, acute  Fever of 100F or higher   For urgent or emergent issues, a gastroenterologist can be reached at any hour by calling [phone]. Do not use MyChart messaging for urgent concerns.    DIET:  We do recommend a small meal at first, but then you may proceed to your regular diet.  Drink plenty of fluids but you should avoid alcoholic beverages for 24  hours.  ACTIVITY:  You should plan to take it easy for the rest of today and you should NOT DRIVE or use heavy machinery until tomorrow (because of the sedation medicines used during the test).    FOLLOW UP: Our staff will call the number listed on your records 48-72 hours following your procedure to check on you and address any questions or concerns that you may have regarding the information given to you following your procedure. If we do not reach you, we will leave a message.  We will attempt to reach you two times.  During this call, we will ask if you have developed any symptoms of COVID 19. If you develop any symptoms (ie: fever, flu-like symptoms, shortness of breath, cough etc.) before then, please call [phone].  If you test positive for Covid 19 in the 2 weeks post procedure, please call and report this information to us.    If any biopsies were taken you will be contacted by phone or by letter within the next 1-3 weeks.  Please call us at [phone] if you have not heard about the biopsies in 3 weeks.    SIGNATURES/CONFIDENTIALITY: You and/or your care partner have signed paperwork which will be entered into your electronic medical record.  These signatures attest to the fact that that the information above on your After Visit Summary has been reviewed and is understood.  Full responsibility of the confidentiality of this discharge information lies with you and/or your   care-partner.  

## 2021-08-08 NOTE — Op Note (Signed)
[place] Patient Name: Laura Mccarthy Procedure Date: 08/08/2021 10:16 AM MRN: [id number] Endoscopist: Docia Chuck. Henrene Pastor , MD Age: 65 Referring MD:  Date of Birth: Nov 17, 1956 Gender: Female Account #: [id number] Procedure:                Colonoscopy with cold snare polypectomy x 5 Indications:              High risk colon cancer surveillance: Personal                            history of adenoma (10 mm or greater in size), High                            risk colon cancer surveillance: Personal history of                            multiple (3 or more) adenomas. Previous examination                            2017 Medicines:                Monitored Anesthesia Care Procedure:                Pre-Anesthesia Assessment:                           - Prior to the procedure, a History and Physical                            was performed, and patient medications and                            allergies were reviewed. The patient's tolerance of                            previous anesthesia was also reviewed. The risks                            and benefits of the procedure and the sedation                            options and risks were discussed with the patient.                            All questions were answered, and informed consent                            was obtained. Prior Anticoagulants: The patient has                            taken no previous anticoagulant or antiplatelet                            agents. ASA Grade Assessment: II - A patient with  mild systemic disease. After reviewing the risks                            and benefits, the patient was deemed in                            satisfactory condition to undergo the procedure.                           After obtaining informed consent, the colonoscope                            was passed under direct vision. Throughout the                            procedure, the patient's  blood pressure, pulse, and                            oxygen saturations were monitored continuously. The                            CF [id number] was introduced through the anus                            and advanced to the the cecum, identified by                            appendiceal orifice and ileocecal valve. The                            ileocecal valve, appendiceal orifice, and rectum                            were photographed. The quality of the bowel                            preparation was good. The colonoscopy was performed                            without difficulty. The patient tolerated the                            procedure well. The bowel preparation used was                            SUPREP via split dose instruction. THE PATIENT HAD                            DIFFICULTY WITH HER PREP. She was given 2 cleansing                            enemas preprocedure. Vigorous irrigation and  suction was required to achieve good quality prep. Scope In: 10:21:29 AM Scope Out: 10:44:41 AM Scope Withdrawal Time: 0 hours 18 minutes 24 seconds  Total Procedure Duration: 0 hours 23 minutes 12 seconds  Findings:                 Five polyps were found in the descending colon and                            ascending colon. The polyps were 2 to 8 mm in size.                            These polyps were removed with a cold snare.                            Resection and retrieval were complete.                           The exam was otherwise without abnormality on                            direct and retroflexion views. Complications:            No immediate complications. Estimated blood loss:                            None. Estimated Blood Loss:     Estimated blood loss: none. Impression:               - Five 2 to 8 mm polyps in the descending colon and                            in the ascending colon, removed with a cold snare.                             Resected and retrieved.                           - The examination was otherwise normal on direct                            and retroflexion views. Recommendation:           - Repeat colonoscopy in 3 years for surveillance.                            MORE VIGOROUS PREP                           - Patient has a contact number available for                            emergencies. The signs and symptoms of potential                            delayed complications were discussed with the  patient. Return to normal activities tomorrow.                            Written discharge instructions were provided to the                            patient.                           - Resume previous diet.                           - Continue present medications.                           - Await pathology results. Docia Chuck. Henrene Pastor, MD 08/08/2021 10:55:50 AM This report has been signed electronically.

## 2021-08-08 NOTE — Progress Notes (Signed)
Pt initial BM was dark and cloudy. First enema was given and the BM was slightly lighter. Dr. Henrene Pastor ordered another enema. Pt tolerated well and the last BM was yellow and much clearer.

## 2021-08-10 ENCOUNTER — Telehealth: Payer: Self-pay

## 2021-08-10 NOTE — Telephone Encounter (Signed)
  Follow up Call-  Call back number 08/08/2021  Post procedure Call Back phone  # [phone]  Permission to leave phone message Yes  Some recent data might be hidden     Patient questions:  Do you have a fever, pain , or abdominal swelling? No. Pain Score  0 *  Have you tolerated food without any problems? Yes.    Have you been able to return to your normal activities? Yes.    Do you have any questions about your discharge instructions: Diet   No. Medications  No. Follow up visit  No.  Do you have questions or concerns about your Care? No.  Actions: * If pain score is 4 or above: No action needed, pain <4.

## 2021-08-13 ENCOUNTER — Encounter: Payer: Self-pay | Admitting: Internal Medicine

## 2021-08-17 DIAGNOSIS — H2513 Age-related nuclear cataract, bilateral: Secondary | ICD-10-CM | POA: Diagnosis not present

## 2021-08-17 DIAGNOSIS — H40053 Ocular hypertension, bilateral: Secondary | ICD-10-CM | POA: Diagnosis not present

## 2021-08-17 DIAGNOSIS — H04123 Dry eye syndrome of bilateral lacrimal glands: Secondary | ICD-10-CM | POA: Diagnosis not present

## 2021-08-17 DIAGNOSIS — H10813 Pingueculitis, bilateral: Secondary | ICD-10-CM | POA: Diagnosis not present

## 2021-08-22 ENCOUNTER — Other Ambulatory Visit: Payer: Self-pay

## 2021-08-22 ENCOUNTER — Other Ambulatory Visit (HOSPITAL_COMMUNITY): Payer: Self-pay

## 2021-08-22 MED ORDER — BUPROPION HCL ER (XL) 150 MG PO TB24
ORAL_TABLET | ORAL | 0 refills | Status: DC
  Filled 2021-08-22: qty 90, 30d supply, fill #0

## 2021-08-22 MED FILL — Amlodipine Besylate Tab 10 MG (Base Equivalent): ORAL | 90 days supply | Qty: 90 | Fill #1 | Status: AC

## 2021-08-24 ENCOUNTER — Other Ambulatory Visit (HOSPITAL_COMMUNITY): Payer: Self-pay

## 2021-08-27 ENCOUNTER — Other Ambulatory Visit (HOSPITAL_COMMUNITY): Payer: Self-pay

## 2021-09-10 ENCOUNTER — Other Ambulatory Visit (HOSPITAL_COMMUNITY): Payer: Self-pay

## 2021-09-10 MED ORDER — DEXTROAMPHETAMINE SULFATE 10 MG PO TABS
ORAL_TABLET | ORAL | 0 refills | Status: DC
  Filled 2021-09-10: qty 360, 90d supply, fill #0

## 2021-09-10 MED ORDER — BUPROPION HCL ER (XL) 150 MG PO TB24
ORAL_TABLET | ORAL | 4 refills | Status: DC
  Filled 2021-09-10 (×2): qty 270, 90d supply, fill #0
  Filled 2022-01-03: qty 270, 90d supply, fill #1
  Filled 2022-05-16: qty 270, 90d supply, fill #2
  Filled 2022-08-18: qty 270, 90d supply, fill #3

## 2021-09-10 MED ORDER — DULOXETINE HCL 60 MG PO CPEP
ORAL_CAPSULE | Freq: Every day | ORAL | 4 refills | Status: DC
  Filled 2021-09-10: qty 90, 90d supply, fill #0
  Filled 2021-12-20: qty 90, 90d supply, fill #1
  Filled 2022-04-10: qty 90, 90d supply, fill #2
  Filled 2022-07-05: qty 90, 90d supply, fill #3

## 2021-09-25 ENCOUNTER — Ambulatory Visit: Payer: 59 | Admitting: Obstetrics and Gynecology

## 2021-12-03 ENCOUNTER — Other Ambulatory Visit (HOSPITAL_COMMUNITY): Payer: Self-pay

## 2021-12-03 MED FILL — Amlodipine Besylate Tab 10 MG (Base Equivalent): ORAL | 90 days supply | Qty: 90 | Fill #2 | Status: AC

## 2021-12-13 ENCOUNTER — Encounter: Payer: Self-pay | Admitting: Family Medicine

## 2021-12-13 ENCOUNTER — Other Ambulatory Visit: Payer: Self-pay | Admitting: Family Medicine

## 2021-12-13 DIAGNOSIS — D508 Other iron deficiency anemias: Secondary | ICD-10-CM

## 2021-12-13 DIAGNOSIS — Z9884 Bariatric surgery status: Secondary | ICD-10-CM

## 2021-12-13 DIAGNOSIS — I10 Essential (primary) hypertension: Secondary | ICD-10-CM

## 2021-12-13 DIAGNOSIS — E559 Vitamin D deficiency, unspecified: Secondary | ICD-10-CM

## 2021-12-13 DIAGNOSIS — E538 Deficiency of other specified B group vitamins: Secondary | ICD-10-CM

## 2021-12-18 ENCOUNTER — Other Ambulatory Visit: Payer: Self-pay

## 2021-12-18 ENCOUNTER — Other Ambulatory Visit (INDEPENDENT_AMBULATORY_CARE_PROVIDER_SITE_OTHER): Payer: Medicare Other

## 2021-12-18 DIAGNOSIS — I10 Essential (primary) hypertension: Secondary | ICD-10-CM | POA: Diagnosis not present

## 2021-12-18 DIAGNOSIS — E559 Vitamin D deficiency, unspecified: Secondary | ICD-10-CM | POA: Diagnosis not present

## 2021-12-18 DIAGNOSIS — D508 Other iron deficiency anemias: Secondary | ICD-10-CM | POA: Diagnosis not present

## 2021-12-18 DIAGNOSIS — E538 Deficiency of other specified B group vitamins: Secondary | ICD-10-CM

## 2021-12-18 DIAGNOSIS — Z9884 Bariatric surgery status: Secondary | ICD-10-CM

## 2021-12-18 LAB — COMPREHENSIVE METABOLIC PANEL
ALT: 11 U/L (ref 0–35)
AST: 13 U/L (ref 0–37)
Albumin: 4.1 g/dL (ref 3.5–5.2)
Alkaline Phosphatase: 67 U/L (ref 39–117)
BUN: 20 mg/dL (ref 6–23)
CO2: 31 mEq/L (ref 19–32)
Calcium: 9.4 mg/dL (ref 8.4–10.5)
Chloride: 102 mEq/L (ref 96–112)
Creatinine, Ser: 0.98 mg/dL (ref 0.40–1.20)
GFR: 60.08 mL/min (ref >60.00)
Glucose, Bld: 81 mg/dL (ref 70–99)
Potassium: 4.2 mEq/L (ref 3.5–5.1)
Sodium: 140 mEq/L (ref 135–145)
Total Bilirubin: 0.4 mg/dL (ref 0.2–1.2)
Total Protein: 6.9 g/dL (ref 6.0–8.3)

## 2021-12-18 LAB — CBC WITH DIFFERENTIAL/PLATELET
Basophils Absolute: 0.1 10*3/uL (ref 0.0–0.1)
Basophils Relative: 0.9 % (ref 0.0–3.0)
Eosinophils Absolute: 0.1 10*3/uL (ref 0.0–0.7)
Eosinophils Relative: 2 % (ref 0.0–5.0)
HCT: 40 % (ref 36.0–46.0)
Hemoglobin: 12.9 g/dL (ref 12.0–15.0)
Lymphocytes Relative: 29.4 % (ref 12.0–46.0)
Lymphs Abs: 1.7 10*3/uL (ref 0.7–4.0)
MCHC: 32.2 g/dL (ref 30.0–36.0)
MCV: 88.3 fl (ref 78.0–100.0)
Monocytes Absolute: 0.6 10*3/uL (ref 0.1–1.0)
Monocytes Relative: 9.8 % (ref 3.0–12.0)
Neutro Abs: 3.4 10*3/uL (ref 1.4–7.7)
Neutrophils Relative %: 57.9 % (ref 43.0–77.0)
Platelets: 303 10*3/uL (ref 150.0–400.0)
RBC: 4.53 Mil/uL (ref 3.87–5.11)
RDW: 14.8 % (ref 11.5–15.5)
WBC: 5.9 10*3/uL (ref 4.0–10.5)

## 2021-12-18 LAB — LIPID PANEL
Cholesterol: 182 mg/dL (ref 0–200)
HDL: 57.6 mg/dL (ref >39.00)
LDL Cholesterol: 110 mg/dL — ABNORMAL HIGH (ref 0–99)
NonHDL: 124.8
Total CHOL/HDL Ratio: 3
Triglycerides: 76 mg/dL (ref 0.0–149.0)
VLDL: 15.2 mg/dL (ref 0.0–40.0)

## 2021-12-18 LAB — FERRITIN: Ferritin: 7.4 ng/mL — ABNORMAL LOW (ref 10.0–291.0)

## 2021-12-18 LAB — FOLATE: Folate: 9.4 ng/mL (ref >5.9)

## 2021-12-18 LAB — TSH: TSH: 1.95 u[IU]/mL (ref 0.35–5.50)

## 2021-12-18 LAB — MICROALBUMIN / CREATININE URINE RATIO
Creatinine,U: 235 mg/dL
Microalb Creat Ratio: 1.4 mg/g (ref 0.0–30.0)
Microalb, Ur: 3.2 mg/dL — ABNORMAL HIGH (ref 0.0–1.9)

## 2021-12-18 LAB — VITAMIN D 25 HYDROXY (VIT D DEFICIENCY, FRACTURES): VITD: 23.83 ng/mL — ABNORMAL LOW (ref 30.00–100.00)

## 2021-12-18 LAB — IBC PANEL
Iron: 54 ug/dL (ref 42–145)
Saturation Ratios: 13.8 % — ABNORMAL LOW (ref 20.0–50.0)
TIBC: 390.6 ug/dL (ref 250.0–450.0)
Transferrin: 279 mg/dL (ref 212.0–360.0)

## 2021-12-18 LAB — VITAMIN B12: Vitamin B-12: 284 pg/mL (ref 211–911)

## 2021-12-20 ENCOUNTER — Other Ambulatory Visit (HOSPITAL_COMMUNITY): Payer: Self-pay

## 2021-12-22 LAB — VITAMIN B1: Vitamin B1 (Thiamine): 16 nmol/L (ref 8–30)

## 2021-12-25 ENCOUNTER — Other Ambulatory Visit: Payer: Self-pay

## 2021-12-25 ENCOUNTER — Encounter: Payer: Self-pay | Admitting: Family Medicine

## 2021-12-25 ENCOUNTER — Ambulatory Visit (INDEPENDENT_AMBULATORY_CARE_PROVIDER_SITE_OTHER): Payer: Medicare Other | Admitting: Family Medicine

## 2021-12-25 ENCOUNTER — Other Ambulatory Visit (HOSPITAL_COMMUNITY): Payer: Self-pay

## 2021-12-25 VITALS — BP 120/64 | HR 98 | Temp 98.0°F | Ht 63.0 in | Wt 184.6 lb

## 2021-12-25 DIAGNOSIS — I10 Essential (primary) hypertension: Secondary | ICD-10-CM

## 2021-12-25 DIAGNOSIS — Z9884 Bariatric surgery status: Secondary | ICD-10-CM | POA: Diagnosis not present

## 2021-12-25 DIAGNOSIS — Z7189 Other specified counseling: Secondary | ICD-10-CM | POA: Diagnosis not present

## 2021-12-25 DIAGNOSIS — F988 Other specified behavioral and emotional disorders with onset usually occurring in childhood and adolescence: Secondary | ICD-10-CM

## 2021-12-25 DIAGNOSIS — F331 Major depressive disorder, recurrent, moderate: Secondary | ICD-10-CM

## 2021-12-25 DIAGNOSIS — E538 Deficiency of other specified B group vitamins: Secondary | ICD-10-CM

## 2021-12-25 DIAGNOSIS — M85851 Other specified disorders of bone density and structure, right thigh: Secondary | ICD-10-CM

## 2021-12-25 DIAGNOSIS — E669 Obesity, unspecified: Secondary | ICD-10-CM | POA: Diagnosis not present

## 2021-12-25 DIAGNOSIS — E559 Vitamin D deficiency, unspecified: Secondary | ICD-10-CM

## 2021-12-25 DIAGNOSIS — E611 Iron deficiency: Secondary | ICD-10-CM

## 2021-12-25 DIAGNOSIS — Z0000 Encounter for general adult medical examination without abnormal findings: Secondary | ICD-10-CM | POA: Insufficient documentation

## 2021-12-25 MED ORDER — IRON (FERROUS SULFATE) 325 (65 FE) MG PO TABS: 325.0000 mg | ORAL_TABLET | ORAL | Status: AC

## 2021-12-25 MED ORDER — AMLODIPINE BESYLATE 10 MG PO TABS
ORAL_TABLET | Freq: Every day | ORAL | 3 refills | Status: DC
  Filled 2021-12-25: qty 90, fill #0
  Filled 2022-03-11: qty 90, 90d supply, fill #0
  Filled 2022-06-28: qty 90, 90d supply, fill #1
  Filled 2022-10-24: qty 90, 90d supply, fill #2

## 2021-12-25 NOTE — Assessment & Plan Note (Signed)
Chronic, great control on amlodipine 10mg  daily - continue.

## 2021-12-25 NOTE — Assessment & Plan Note (Signed)
Advanced directive discussion - does not have. Discussed. New packet provided. Husband Richard then niece Burnis Medin would be HCPOA.

## 2021-12-25 NOTE — Assessment & Plan Note (Addendum)
Congratulated on ongoing weight loss to date. Encouraged continued healthy diet and lifestyle choices to affect sustainable weight loss.

## 2021-12-25 NOTE — Assessment & Plan Note (Signed)
Anemia has resolved, iron levels remain low - rec restart oral iron QOD dosing.

## 2021-12-25 NOTE — Assessment & Plan Note (Signed)
Reviewed latest labs

## 2021-12-25 NOTE — Assessment & Plan Note (Signed)

## 2021-12-25 NOTE — Assessment & Plan Note (Signed)
Reviewed latest DEXA with patient.  Reviewed recommended calcium and vit D intake.  rec continued regular weight bearing exercise. Rpt DEXA in 5 yrs.

## 2021-12-25 NOTE — Assessment & Plan Note (Signed)
Continues wellbutrin XL 450mg  daily followed by psychiatry

## 2021-12-25 NOTE — Patient Instructions (Addendum)
EKG today  Continue good calcium in the diet, vitamin D and regular walking for bone strength.  Advanced directive packet provided today.  Start iron every other day, restart daily vitamin D. Take vitamin b12 Monday, Wednesday, Fridays.  Check with Dr Toy Care about ADD meds.  Good to see you today Return as needed or in 1 year for next wellness visit   Health Maintenance After Age 67 After age 38, you are at a higher risk for certain long-term diseases and infections as well as injuries from falls. Falls are a major cause of broken bones and head injuries in people who are older than age 81. Getting regular preventive care can help to keep you healthy and well. Preventive care includes getting regular testing and making lifestyle changes as recommended by your health care provider. Talk with your health care provider about: Which screenings and tests you should have. A screening is a test that checks for a disease when you have no symptoms. A diet and exercise plan that is right for you. What should I know about screenings and tests to prevent falls? Screening and testing are the best ways to find a health problem early. Early diagnosis and treatment give you the best chance of managing medical conditions that are common after age 40. Certain conditions and lifestyle choices may make you more likely to have a fall. Your health care provider may recommend: Regular vision checks. Poor vision and conditions such as cataracts can make you more likely to have a fall. If you wear glasses, make sure to get your prescription updated if your vision changes. Medicine review. Work with your health care provider to regularly review all of the medicines you are taking, including over-the-counter medicines. Ask your health care provider about any side effects that may make you more likely to have a fall. Tell your health care provider if any medicines that you take make you feel dizzy or sleepy. Strength and balance  checks. Your health care provider may recommend certain tests to check your strength and balance while standing, walking, or changing positions. Foot health exam. Foot pain and numbness, as well as not wearing proper footwear, can make you more likely to have a fall. Screenings, including: Osteoporosis screening. Osteoporosis is a condition that causes the bones to get weaker and break more easily. Blood pressure screening. Blood pressure changes and medicines to control blood pressure can make you feel dizzy. Depression screening. You may be more likely to have a fall if you have a fear of falling, feel depressed, or feel unable to do activities that you used to do. Alcohol use screening. Using too much alcohol can affect your balance and may make you more likely to have a fall. Follow these instructions at home: Lifestyle Do not drink alcohol if: Your health care provider tells you not to drink. If you drink alcohol: Limit how much you have to: 0-1 drink a day for women. 0-2 drinks a day for men. Know how much alcohol is in your drink. In the [place] one drink equals one 12 oz bottle of beer (355 mL), one 5 oz glass of wine (148 mL), or one 1 oz glass of hard liquor (44 mL). Do not use any products that contain nicotine or tobacco. These products include cigarettes, chewing tobacco, and vaping devices, such as e-cigarettes. If you need help quitting, ask your health care provider. Activity  Follow a regular exercise program to stay fit. This will help you maintain your balance.  Ask your health care provider what types of exercise are appropriate for you. If you need a cane or walker, use it as recommended by your health care provider. Wear supportive shoes that have nonskid soles. Safety  Remove any tripping hazards, such as rugs, cords, and clutter. Install safety equipment such as grab bars in bathrooms and safety rails on stairs. Keep rooms and walkways well-lit. General  instructions Talk with your health care provider about your risks for falling. Tell your health care provider if: You fall. Be sure to tell your health care provider about all falls, even ones that seem minor. You feel dizzy, tiredness (fatigue), or off-balance. Take over-the-counter and prescription medicines only as told by your health care provider. These include supplements. Eat a healthy diet and maintain a healthy weight. A healthy diet includes low-fat dairy products, low-fat (lean) meats, and fiber from whole grains, beans, and lots of fruits and vegetables. Stay current with your vaccines. Schedule regular health, dental, and eye exams. Summary Having a healthy lifestyle and getting preventive care can help to protect your health and wellness after age 7. Screening and testing are the best way to find a health problem early and help you avoid having a fall. Early diagnosis and treatment give you the best chance for managing medical conditions that are more common for people who are older than age 69. Falls are a major cause of broken bones and head injuries in people who are older than age 32. Take precautions to prevent a fall at home. Work with your health care provider to learn what changes you can make to improve your health and wellness and to prevent falls. This information is not intended to replace advice given to you by your health care provider. Make sure you discuss any questions you have with your health care provider. Document Revised: 04/02/2021 Document Reviewed: 04/02/2021 Elsevier Patient Education  [employer].

## 2021-12-25 NOTE — Progress Notes (Signed)
Patient ID: Laura Mccarthy, female    DOB: 02-12-1956, 66 y.o.   MRN: [id number]  This visit was conducted in person.  BP 120/64    Pulse 98    Temp 98 F (36.7 C) (Temporal)    Ht 5\' 3"  (1.6 m)    Wt 184 lb 9 oz (83.7 kg)    SpO2 97%    BMI 32.69 kg/m    CC: welcome to medicare Subjective:   HPI: [name] is a 66 y.o. female presenting on 12/25/2021 for Medicare Wellness   Retired Sept 30/2022.  Medicare 08/25/2021.  Working part time.  New puppy at home.   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  COLONOSCOPY 07/2021 - 5 TAs, rpt 3 yrs Henrene Pastor)  Well woman with OBGYN Dr Talbert Nan, last seen 05/2021. Last normal pap 2019.  [facility]  DEXA 06/2021 - T -2.1 R femur - osteopenia, not at higher risk of bone fracture. H/o elevated vit D levels. Planning to restart. Good calcium in diet (likely 1200mg /day). Good walking with new puppy.  Lung cancer screening - not eligible Flu shot - yearly  [place] 11/2019 x2, booster 08/2020 Tetanus shot - unsure -  Pneumovax - 11/2020  Shingrix - 10/2019, 01/2020 Advanced directive discussion - does not have. Discussed. New packet provided. Husband Richard then niece Burnis Medin would be HCPOA.  Seat belt use discussed Sunscreen use discussed. No changing moles on skin. 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 Interim medical history since our last visit reviewed. Allergies and medications reviewed and updated. Outpatient Medications Prior to Visit  Medication Sig Dispense Refill   acetaminophen (TYLENOL) 500 MG tablet Take 1,000 mg by mouth every 6 (six)  hours as needed for mild pain or moderate pain.     albuterol (VENTOLIN HFA) 108 (90 Base) MCG/ACT inhaler INHALE 2 PUFFS INTO THE LUNGS EVERY 6 HOURS AS NEEDED FOR WHEEZING OR SHORTNESS OF BREATH. 8.5 g 1   amphetamine-dextroamphetamine (ADDERALL XR) 15 MG 24 hr capsule Take 2 capsules by mouth every morning.  0   buPROPion (WELLBUTRIN XL) 150 MG 24 hr tablet Take 3 tablets by mouth every morning 270 tablet 4   Cyanocobalamin (B-12) 1000 MCG SUBL Place 1 tablet under the tongue every Monday, Wednesday, and Friday.     DULoxetine (CYMBALTA) 60 MG capsule Take 1 capsule by mouth daily 90 capsule 4   Multiple Vitamins-Minerals (MULTIVITAL PO) Take by mouth.     amLODipine (NORVASC) 10 MG tablet TAKE 1 TABLET BY MOUTH DAILY. 90 tablet 3   buPROPion (WELLBUTRIN XL) 150 MG 24 hr tablet Take 3 tablets (450 mg total) by mouth daily.     Cholecalciferol (VITAMIN D3) 25 MCG (1000 UT) CAPS Take 1 capsule (1,000 Units total) by mouth daily. (Patient not taking: Reported on 07/25/2021) 30 capsule    dextroamphetamine (DEXTROSTAT) 10 MG tablet Take 2 tablets by mouth every morning and 2 tablets at noon 360 tablet 0   DULoxetine (CYMBALTA) 60 MG capsule Take 60 mg by mouth every morning.     No facility-administered medications prior to  visit.     Per HPI unless specifically indicated in ROS section below Review of Systems  Objective:  BP 120/64    Pulse 98    Temp 98 F (36.7 C) (Temporal)    Ht 5\' 3"  (1.6 m)    Wt 184 lb 9 oz (83.7 kg)    SpO2 97%    BMI 32.69 kg/m   Wt Readings from Last 3 Encounters:  12/25/21 184 lb 9 oz (83.7 kg)  08/08/21 195 lb (88.5 kg)  07/25/21 195 lb (88.5 kg)      Physical Exam Vitals and nursing note reviewed.  Constitutional:      Appearance: Normal appearance. She is not ill-appearing.  HENT:     Head: Normocephalic and atraumatic.     Right Ear: Tympanic membrane, ear canal and external ear normal. There is no impacted cerumen.     Left Ear: Tympanic membrane,  ear canal and external ear normal. There is no impacted cerumen.  Eyes:     General:        Right eye: No discharge.        Left eye: No discharge.     Extraocular Movements: Extraocular movements intact.     Conjunctiva/sclera: Conjunctivae normal.     Pupils: Pupils are equal, round, and reactive to light.  Neck:     Thyroid: No thyroid mass or thyromegaly.     Vascular: No carotid bruit.  Cardiovascular:     Rate and Rhythm: Normal rate and regular rhythm.     Pulses: Normal pulses.     Heart sounds: Normal heart sounds. No murmur heard. Pulmonary:     Effort: Pulmonary effort is normal. No respiratory distress.     Breath sounds: Normal breath sounds. No wheezing, rhonchi or rales.  Abdominal:     General: Bowel sounds are normal. There is no distension.     Palpations: Abdomen is soft. There is no mass.     Tenderness: There is no abdominal tenderness. There is no guarding or rebound.     Hernia: No hernia is present.  Musculoskeletal:     Cervical back: Normal range of motion and neck supple. No rigidity.     Right lower leg: No edema.     Left lower leg: No edema.  Lymphadenopathy:     Cervical: No cervical adenopathy.  Skin:    General: Skin is warm and dry.     Findings: No rash.  Neurological:     General: No focal deficit present.     Mental Status: She is alert. Mental status is at baseline.     Comments:  Recall 3/3  Calculation 4/5 serial 3s  Psychiatric:        Mood and Affect: Mood normal.        Behavior: Behavior normal.      Results for orders placed or performed in visit on 12/18/21  Microalbumin / creatinine urine ratio  Result Value Ref Range   Microalb, Ur 3.2 (H) 0.0 - 1.9 mg/dL   Creatinine,U 235.0 mg/dL   Microalb Creat Ratio 1.4 0.0 - 30.0 mg/g  CBC with Differential/Platelet  Result Value Ref Range   WBC 5.9 4.0 - 10.5 K/uL   RBC 4.53 3.87 - 5.11 Mil/uL   Hemoglobin 12.9 12.0 - 15.0 g/dL   HCT 40.0 36.0 - 46.0 %   MCV 88.3 78.0 -  100.0 fl   MCHC 32.2 30.0 - 36.0 g/dL   RDW 14.8 11.5 - 15.5 %  Platelets 303.0 150.0 - 400.0 K/uL   Neutrophils Relative % 57.9 43.0 - 77.0 %   Lymphocytes Relative 29.4 12.0 - 46.0 %   Monocytes Relative 9.8 3.0 - 12.0 %   Eosinophils Relative 2.0 0.0 - 5.0 %   Basophils Relative 0.9 0.0 - 3.0 %   Neutro Abs 3.4 1.4 - 7.7 K/uL   Lymphs Abs 1.7 0.7 - 4.0 K/uL   Monocytes Absolute 0.6 0.1 - 1.0 K/uL   Eosinophils Absolute 0.1 0.0 - 0.7 K/uL   Basophils Absolute 0.1 0.0 - 0.1 K/uL  TSH  Result Value Ref Range   TSH 1.95 0.35 - 5.50 uIU/mL  Comprehensive metabolic panel  Result Value Ref Range   Sodium 140 135 - 145 mEq/L   Potassium 4.2 3.5 - 5.1 mEq/L   Chloride 102 96 - 112 mEq/L   CO2 31 19 - 32 mEq/L   Glucose, Bld 81 70 - 99 mg/dL   BUN 20 6 - 23 mg/dL   Creatinine, Ser 0.98 0.40 - 1.20 mg/dL   Total Bilirubin 0.4 0.2 - 1.2 mg/dL   Alkaline Phosphatase 67 39 - 117 U/L   AST 13 0 - 37 U/L   ALT 11 0 - 35 U/L   Total Protein 6.9 6.0 - 8.3 g/dL   Albumin 4.1 3.5 - 5.2 g/dL   GFR 60.08 >60.00 mL/min   Calcium 9.4 8.4 - 10.5 mg/dL  Lipid panel  Result Value Ref Range   Cholesterol 182 0 - 200 mg/dL   Triglycerides 76.0 0.0 - 149.0 mg/dL   HDL 57.60 >39.00 mg/dL   VLDL 15.2 0.0 - 40.0 mg/dL   LDL Cholesterol 110 (H) 0 - 99 mg/dL   Total CHOL/HDL Ratio 3    NonHDL 124.80   Vitamin B1  Result Value Ref Range   Vitamin B1 (Thiamine) 16 8 - 30 nmol/L  VITAMIN D 25 Hydroxy (Vit-D Deficiency, Fractures)  Result Value Ref Range   VITD 23.83 (L) 30.00 - 100.00 ng/mL  Folate  Result Value Ref Range   Folate 9.4 >5.9 ng/mL  IBC panel  Result Value Ref Range   Iron 54 42 - 145 ug/dL   Transferrin 279.0 212.0 - 360.0 mg/dL   Saturation Ratios 13.8 (L) 20.0 - 50.0 %   TIBC 390.6 250.0 - 450.0 mcg/dL  Ferritin  Result Value Ref Range   Ferritin 7.4 (L) 10.0 - 291.0 ng/mL  Vitamin B12  Result Value Ref Range   Vitamin B-12 284 211 - 911 pg/mL   EKG - NSR rate 90,  normal axis, intervals, no hypertrophy or acute ST/T changes  Assessment & Plan:  This visit occurred during the SARS-CoV-2 public health emergency.  Safety protocols were in place, including screening questions prior to the visit, additional usage of staff PPE, and extensive cleaning of exam room while observing appropriate contact time as indicated for disinfecting solutions.   Problem List Items Addressed This Visit     Welcome to Medicare preventive visit - Primary (Chronic)    I have personally reviewed the Medicare Annual Wellness questionnaire and have noted 1. The patient's medical and social history 2. Their use of alcohol, tobacco or illicit drugs 3. Their current medications and supplements 4. The patient's functional ability including ADL's, fall risks, home safety risks and hearing or visual impairment. Cognitive function has been assessed and addressed as indicated.  5. Diet and physical activity 6. Evidence for depression or mood disorders The patients weight, height, BMI have been  recorded in the chart. I have made referrals, counseling and provided education to the patient based on review of the above and I have provided the pt with a written personalized care plan for preventive services. Provider list updated.. See scanned questionairre as needed for further documentation. Reviewed preventative protocols and updated unless pt declined.       Relevant Orders   EKG 12-Lead (Completed)   Advanced directives, counseling/discussion (Chronic)    Advanced directive discussion - does not have. Discussed. New packet provided. Husband Richard then niece Burnis Medin would be HCPOA.       Obesity, Class I, BMI 30-34.9    Congratulated on ongoing weight loss to date. Encouraged continued healthy diet and lifestyle choices to affect sustainable weight loss.       S/P laparoscopic sleeve gastrectomy    Reviewed latest labs      MDD (major depressive disorder), recurrent  episode, moderate ([place])    Continues wellbutrin XL 450mg  daily followed by psychiatry       ADD (attention deficit disorder)    Followed by psychiatry       Essential hypertension    Chronic, great control on amlodipine 10mg  daily - continue.       Relevant Medications   amLODipine (NORVASC) 10 MG tablet   Vitamin D deficiency    Levels low - had not been taking vit D replacement - rec restart 1000 IU daily.       Vitamin B12 deficiency    Levels low normal - rec continue vit B12 105mcg MWF dosing      Iron deficiency    Anemia has resolved, iron levels remain low - rec restart oral iron QOD dosing.        Osteopenia    Reviewed latest DEXA with patient.  Reviewed recommended calcium and vit D intake.  rec continued regular weight bearing exercise. Rpt DEXA in 5 yrs.         Meds ordered this encounter  Medications   amLODipine (NORVASC) 10 MG tablet    Sig: TAKE 1 TABLET BY MOUTH DAILY.    Dispense:  90 tablet    Refill:  3   Iron, Ferrous Sulfate, 325 (65 Fe) MG TABS    Sig: Take 325 mg by mouth every Monday, Wednesday, and Friday.    Dispense:  30 tablet   Orders Placed This Encounter  Procedures   EKG 12-Lead     Patient instructions: EKG today  Continue good calcium in the diet, vitamin D and regular walking for bone strength.  Advanced directive packet provided today.  Start iron every other day, restart daily vitamin D. Take vitamin b12 Monday, Wednesday, Fridays.  Check with Dr Toy Care about ADD meds.  Good to see you today Return as needed or in 1 year for next wellness visit   Follow up plan: Return in about 1 year (around 12/25/2022) for medicare wellness visit, annual exam, prior fasting for blood work.  Ria Bush, MD

## 2021-12-25 NOTE — Assessment & Plan Note (Signed)
Levels low normal - rec continue vit B12 1091mcg MWF dosing

## 2021-12-25 NOTE — Assessment & Plan Note (Signed)
Levels low - had not been taking vit D replacement - rec restart 1000 IU daily.

## 2021-12-25 NOTE — Assessment & Plan Note (Signed)
Followed by psychiatry 

## 2022-01-03 ENCOUNTER — Other Ambulatory Visit (HOSPITAL_COMMUNITY): Payer: Self-pay

## 2022-01-11 DIAGNOSIS — H40023 Open angle with borderline findings, high risk, bilateral: Secondary | ICD-10-CM | POA: Diagnosis not present

## 2022-01-11 DIAGNOSIS — H40053 Ocular hypertension, bilateral: Secondary | ICD-10-CM | POA: Diagnosis not present

## 2022-01-11 DIAGNOSIS — H2513 Age-related nuclear cataract, bilateral: Secondary | ICD-10-CM | POA: Diagnosis not present

## 2022-01-11 DIAGNOSIS — H10813 Pingueculitis, bilateral: Secondary | ICD-10-CM | POA: Diagnosis not present

## 2022-03-11 ENCOUNTER — Other Ambulatory Visit (HOSPITAL_COMMUNITY): Payer: Self-pay

## 2022-04-10 ENCOUNTER — Other Ambulatory Visit (HOSPITAL_COMMUNITY): Payer: Self-pay

## 2022-05-16 ENCOUNTER — Other Ambulatory Visit (HOSPITAL_COMMUNITY): Payer: Self-pay

## 2022-06-07 ENCOUNTER — Other Ambulatory Visit (HOSPITAL_COMMUNITY): Payer: Self-pay

## 2022-06-11 ENCOUNTER — Other Ambulatory Visit (HOSPITAL_COMMUNITY): Payer: Self-pay

## 2022-06-13 ENCOUNTER — Other Ambulatory Visit (HOSPITAL_COMMUNITY): Payer: Self-pay

## 2022-06-13 MED ORDER — DEXTROAMPHETAMINE SULFATE 10 MG PO TABS
ORAL_TABLET | ORAL | 0 refills | Status: DC
  Filled 2022-06-13: qty 360, 90d supply, fill #0

## 2022-06-13 MED ORDER — NALTREXONE HCL 50 MG PO TABS
ORAL_TABLET | Freq: Every day | ORAL | 4 refills | Status: DC
  Filled 2022-06-13: qty 90, 90d supply, fill #0
  Filled 2023-02-11 – 2023-02-12 (×2): qty 90, 90d supply, fill #1
  Filled 2023-06-02: qty 90, 90d supply, fill #2

## 2022-06-28 ENCOUNTER — Other Ambulatory Visit (HOSPITAL_COMMUNITY): Payer: Self-pay

## 2022-06-28 ENCOUNTER — Other Ambulatory Visit: Payer: Self-pay | Admitting: Family Medicine

## 2022-06-28 DIAGNOSIS — Z1231 Encounter for screening mammogram for malignant neoplasm of breast: Secondary | ICD-10-CM

## 2022-07-05 ENCOUNTER — Other Ambulatory Visit (HOSPITAL_COMMUNITY): Payer: Self-pay

## 2022-07-23 ENCOUNTER — Ambulatory Visit
Admission: RE | Admit: 2022-07-23 | Discharge: 2022-07-23 | Disposition: A | Discharge status: Home / Self Care | Payer: Medicare Other | Source: Ambulatory Visit | Attending: Family Medicine | Admitting: Family Medicine

## 2022-07-23 DIAGNOSIS — Z1231 Encounter for screening mammogram for malignant neoplasm of breast: Secondary | ICD-10-CM

## 2022-08-19 ENCOUNTER — Other Ambulatory Visit (HOSPITAL_COMMUNITY): Payer: Self-pay

## 2022-10-02 DIAGNOSIS — H40023 Open angle with borderline findings, high risk, bilateral: Secondary | ICD-10-CM | POA: Diagnosis not present

## 2022-10-02 DIAGNOSIS — H2513 Age-related nuclear cataract, bilateral: Secondary | ICD-10-CM | POA: Diagnosis not present

## 2022-10-02 DIAGNOSIS — H40053 Ocular hypertension, bilateral: Secondary | ICD-10-CM | POA: Diagnosis not present

## 2022-10-02 DIAGNOSIS — H04123 Dry eye syndrome of bilateral lacrimal glands: Secondary | ICD-10-CM | POA: Diagnosis not present

## 2022-10-07 ENCOUNTER — Other Ambulatory Visit (HOSPITAL_COMMUNITY): Payer: Self-pay

## 2022-10-12 DIAGNOSIS — Z20822 Contact with and (suspected) exposure to covid-19: Secondary | ICD-10-CM | POA: Diagnosis not present

## 2022-10-12 DIAGNOSIS — J069 Acute upper respiratory infection, unspecified: Secondary | ICD-10-CM | POA: Diagnosis not present

## 2022-10-12 DIAGNOSIS — J45909 Unspecified asthma, uncomplicated: Secondary | ICD-10-CM | POA: Diagnosis not present

## 2022-10-12 DIAGNOSIS — R059 Cough, unspecified: Secondary | ICD-10-CM | POA: Diagnosis not present

## 2022-10-24 ENCOUNTER — Other Ambulatory Visit (HOSPITAL_COMMUNITY): Payer: Self-pay

## 2022-10-24 MED ORDER — DULOXETINE HCL 60 MG PO CPEP
60.0000 mg | ORAL_CAPSULE | Freq: Every day | ORAL | 4 refills | Status: DC
  Filled 2022-10-24: qty 90, 90d supply, fill #0
  Filled 2023-02-11 – 2023-02-12 (×2): qty 90, 90d supply, fill #1

## 2022-10-25 ENCOUNTER — Other Ambulatory Visit (HOSPITAL_COMMUNITY): Payer: Self-pay

## 2022-12-05 ENCOUNTER — Other Ambulatory Visit (HOSPITAL_COMMUNITY): Payer: Self-pay

## 2022-12-05 MED ORDER — BUPROPION HCL ER (XL) 150 MG PO TB24
450.0000 mg | ORAL_TABLET | Freq: Every morning | ORAL | 3 refills | Status: DC
  Filled 2022-12-05 – 2022-12-10 (×2): qty 270, 90d supply, fill #0
  Filled 2023-03-22: qty 270, 90d supply, fill #1
  Filled 2023-07-03 – 2023-07-09 (×2): qty 270, 90d supply, fill #2
  Filled 2023-10-11: qty 270, 90d supply, fill #3

## 2022-12-05 MED ORDER — DEXTROAMPHETAMINE SULFATE 10 MG PO TABS
20.0000 mg | ORAL_TABLET | Freq: Two times a day (BID) | ORAL | 0 refills | Status: DC
  Filled 2022-12-05 – 2023-01-01 (×3): qty 360, 90d supply, fill #0

## 2022-12-09 ENCOUNTER — Other Ambulatory Visit (HOSPITAL_COMMUNITY): Payer: Self-pay

## 2022-12-10 ENCOUNTER — Other Ambulatory Visit (HOSPITAL_COMMUNITY): Payer: Self-pay

## 2022-12-10 ENCOUNTER — Other Ambulatory Visit: Payer: Self-pay

## 2022-12-10 ENCOUNTER — Encounter: Payer: Self-pay | Admitting: Pharmacist

## 2022-12-11 ENCOUNTER — Other Ambulatory Visit (HOSPITAL_COMMUNITY): Payer: Self-pay

## 2022-12-16 ENCOUNTER — Other Ambulatory Visit: Payer: Self-pay | Admitting: Family Medicine

## 2022-12-16 DIAGNOSIS — M85851 Other specified disorders of bone density and structure, right thigh: Secondary | ICD-10-CM

## 2022-12-16 DIAGNOSIS — E611 Iron deficiency: Secondary | ICD-10-CM

## 2022-12-16 DIAGNOSIS — E538 Deficiency of other specified B group vitamins: Secondary | ICD-10-CM

## 2022-12-16 DIAGNOSIS — I10 Essential (primary) hypertension: Secondary | ICD-10-CM

## 2022-12-16 DIAGNOSIS — Z9884 Bariatric surgery status: Secondary | ICD-10-CM

## 2022-12-17 ENCOUNTER — Other Ambulatory Visit (HOSPITAL_BASED_OUTPATIENT_CLINIC_OR_DEPARTMENT_OTHER): Payer: Self-pay

## 2022-12-17 ENCOUNTER — Other Ambulatory Visit (HOSPITAL_COMMUNITY): Payer: Self-pay

## 2022-12-17 ENCOUNTER — Other Ambulatory Visit: Payer: Self-pay

## 2022-12-20 ENCOUNTER — Other Ambulatory Visit: Payer: Medicare Other

## 2022-12-27 ENCOUNTER — Encounter: Payer: Medicare Other | Admitting: Family Medicine

## 2023-01-01 ENCOUNTER — Other Ambulatory Visit: Payer: Self-pay

## 2023-01-01 ENCOUNTER — Other Ambulatory Visit (HOSPITAL_COMMUNITY): Payer: Self-pay

## 2023-01-08 ENCOUNTER — Ambulatory Visit (INDEPENDENT_AMBULATORY_CARE_PROVIDER_SITE_OTHER): Payer: Medicare Other

## 2023-01-08 VITALS — Wt 184.0 lb

## 2023-01-08 DIAGNOSIS — Z0000 Encounter for general adult medical examination without abnormal findings: Secondary | ICD-10-CM

## 2023-01-08 NOTE — Patient Instructions (Signed)
Laura Mccarthy , Thank you for taking time to come for your Medicare Wellness Visit. I appreciate your ongoing commitment to your health goals. Please review the following plan we discussed and let me know if I can assist you in the future.   These are the goals we discussed:  Goals      Patient Stated     Get well and feeling better         This is a list of the screening recommended for you and due dates:  Health Maintenance  Topic Date Due   DTaP/Tdap/Td vaccine (1 - Tdap) Never done   Pneumonia Vaccine (2 of 2 - PCV) 12/19/2021   Flu Shot  06/25/2022   COVID-19 Vaccine (4 - 2023-24 season) 07/26/2022   Mammogram  07/24/2023   Medicare Annual Wellness Visit  01/09/2024   Colon Cancer Screening  08/08/2024   DEXA scan (bone density measurement)  Completed   Hepatitis C Screening: USPSTF Recommendation to screen - Ages 21-79 yo.  Completed   Zoster (Shingles) Vaccine  Completed   HPV Vaccine  Aged Out    Advanced directives: Please bring a copy of your health care power of attorney and living will to the office at your convenience.  Conditions/risks identified: get back to feeling better   Next appointment: Follow up in one year for your annual wellness visit    Preventive Care 65 Years and Older, Female Preventive care refers to lifestyle choices and visits with your health care provider that can promote health and wellness. What does preventive care include? A yearly physical exam. This is also called an annual well check. Dental exams once or twice a year. Routine eye exams. Ask your health care provider how often you should have your eyes checked. Personal lifestyle choices, including: Daily care of your teeth and gums. Regular physical activity. Eating a healthy diet. Avoiding tobacco and drug use. Limiting alcohol use. Practicing safe sex. Taking low-dose aspirin every day. Taking vitamin and mineral supplements as recommended by your health care provider. What  happens during an annual well check? The services and screenings done by your health care provider during your annual well check will depend on your age, overall health, lifestyle risk factors, and family history of disease. Counseling  Your health care provider may ask you questions about your: Alcohol use. Tobacco use. Drug use. Emotional well-being. Home and relationship well-being. Sexual activity. Eating habits. History of falls. Memory and ability to understand (cognition). Work and work Statistician. Reproductive health. Screening  You may have the following tests or measurements: Height, weight, and BMI. Blood pressure. Lipid and cholesterol levels. These may be checked every 5 years, or more frequently if you are over 36 years old. Skin check. Lung cancer screening. You may have this screening every year starting at age 43 if you have a 30-pack-year history of smoking and currently smoke or have quit within the past 15 years. Fecal occult blood test (FOBT) of the stool. You may have this test every year starting at age 59. Flexible sigmoidoscopy or colonoscopy. You may have a sigmoidoscopy every 5 years or a colonoscopy every 10 years starting at age 26. Hepatitis C blood test. Hepatitis B blood test. Sexually transmitted disease (STD) testing. Diabetes screening. This is done by checking your blood sugar (glucose) after you have not eaten for a while (fasting). You may have this done every 1-3 years. Bone density scan. This is done to screen for osteoporosis. You may have this  done starting at age 95. Mammogram. This may be done every 1-2 years. Talk to your health care provider about how often you should have regular mammograms. Talk with your health care provider about your test results, treatment options, and if necessary, the need for more tests. Vaccines  Your health care provider may recommend certain vaccines, such as: Influenza vaccine. This is recommended every  year. Tetanus, diphtheria, and acellular pertussis (Tdap, Td) vaccine. You may need a Td booster every 10 years. Zoster vaccine. You may need this after age 20. Pneumococcal 13-valent conjugate (PCV13) vaccine. One dose is recommended after age 13. Pneumococcal polysaccharide (PPSV23) vaccine. One dose is recommended after age 81. Talk to your health care provider about which screenings and vaccines you need and how often you need them. This information is not intended to replace advice given to you by your health care provider. Make sure you discuss any questions you have with your health care provider. Document Released: 12/08/2015 Document Revised: 07/31/2016 Document Reviewed: 09/12/2015 Elsevier Interactive Patient Education  2017 [place] Prevention in the Home Falls can cause injuries. They can happen to people of all ages. There are many things you can do to make your home safe and to help prevent falls. What can I do on the outside of my home? Regularly fix the edges of walkways and driveways and fix any cracks. Remove anything that might make you trip as you walk through a door, such as a raised step or threshold. Trim any bushes or trees on the path to your home. Use bright outdoor lighting. Clear any walking paths of anything that might make someone trip, such as rocks or tools. Regularly check to see if handrails are loose or broken. Make sure that both sides of any steps have handrails. Any raised decks and porches should have guardrails on the edges. Have any leaves, snow, or ice cleared regularly. Use sand or salt on walking paths during winter. Clean up any spills in your garage right away. This includes oil or grease spills. What can I do in the bathroom? Use night lights. Install grab bars by the toilet and in the tub and shower. Do not use towel bars as grab bars. Use non-skid mats or decals in the tub or shower. If you need to sit down in the shower, use a  plastic, non-slip stool. Keep the floor dry. Clean up any water that spills on the floor as soon as it happens. Remove soap buildup in the tub or shower regularly. Attach bath mats securely with double-sided non-slip rug tape. Do not have throw rugs and other things on the floor that can make you trip. What can I do in the bedroom? Use night lights. Make sure that you have a light by your bed that is easy to reach. Do not use any sheets or blankets that are too big for your bed. They should not hang down onto the floor. Have a firm chair that has side arms. You can use this for support while you get dressed. Do not have throw rugs and other things on the floor that can make you trip. What can I do in the kitchen? Clean up any spills right away. Avoid walking on wet floors. Keep items that you use a lot in easy-to-reach places. If you need to reach something above you, use a strong step stool that has a grab bar. Keep electrical cords out of the way. Do not use floor polish or wax  that makes floors slippery. If you must use wax, use non-skid floor wax. Do not have throw rugs and other things on the floor that can make you trip. What can I do with my stairs? Do not leave any items on the stairs. Make sure that there are handrails on both sides of the stairs and use them. Fix handrails that are broken or loose. Make sure that handrails are as long as the stairways. Check any carpeting to make sure that it is firmly attached to the stairs. Fix any carpet that is loose or worn. Avoid having throw rugs at the top or bottom of the stairs. If you do have throw rugs, attach them to the floor with carpet tape. Make sure that you have a light switch at the top of the stairs and the bottom of the stairs. If you do not have them, ask someone to add them for you. What else can I do to help prevent falls? Wear shoes that: Do not have high heels. Have rubber bottoms. Are comfortable and fit you  well. Are closed at the toe. Do not wear sandals. If you use a stepladder: Make sure that it is fully opened. Do not climb a closed stepladder. Make sure that both sides of the stepladder are locked into place. Ask someone to hold it for you, if possible. Clearly mark and make sure that you can see: Any grab bars or handrails. First and last steps. Where the edge of each step is. Use tools that help you move around (mobility aids) if they are needed. These include: Canes. Walkers. Scooters. Crutches. Turn on the lights when you go into a dark area. Replace any light bulbs as soon as they burn out. Set up your furniture so you have a clear path. Avoid moving your furniture around. If any of your floors are uneven, fix them. If there are any pets around you, be aware of where they are. Review your medicines with your doctor. Some medicines can make you feel dizzy. This can increase your chance of falling. Ask your doctor what other things that you can do to help prevent falls. This information is not intended to replace advice given to you by your health care provider. Make sure you discuss any questions you have with your health care provider. Document Released: 09/07/2009 Document Revised: 04/18/2016 Document Reviewed: 12/16/2014 Elsevier Interactive Patient Education  2017 [employer].

## 2023-01-08 NOTE — Progress Notes (Addendum)
I connected with  Laura Mccarthy on 01/08/23 by a audio enabled telemedicine application and verified that I am speaking with the correct person using two identifiers.  Patient Location: Home  Provider Location: Home Office  I discussed the limitations of evaluation and management by telemedicine. The patient expressed understanding and agreed to proceed.   Subjective:   Laura Mccarthy is a 67 y.o. female who presents for an Initial Medicare Annual Wellness Visit.  Review of Systems     Cardiac Risk Factors include: advanced age (>27mn, >>32women);hypertension     Objective:    Today's Vitals   01/08/23 1425  Weight: 184 lb (83.5 kg)   Body mass index is 32.59 kg/m.     01/08/2023    2:32 PM 11/28/2015    1:55 PM 04/10/2015    5:30 PM 05/24/2014    7:30 PM 05/24/2014   11:15 AM 05/17/2014    2:35 PM  Advanced Directives  Does Patient Have a Medical Advance Directive? Yes No No Patient does not have advance directive Patient does not have advance directive;Patient would not like information Patient does not have advance directive;Patient would not like information  Type of AScientist, forensicPower of [place] will       Copy of HGrenorain Chart? No - copy requested       Would patient like information on creating a medical advance directive?   No - patient declined information     Pre-existing out of facility DNR order (yellow form or pink MOST form)    No  No    Current Medications (verified) Outpatient Encounter Medications as of 01/08/2023  Medication Sig   acetaminophen (TYLENOL) 500 MG tablet Take 1,000 mg by mouth every 6 (six) hours as needed for mild pain or moderate pain.   amLODipine (NORVASC) 10 MG tablet TAKE 1 TABLET BY MOUTH DAILY.   amphetamine-dextroamphetamine (ADDERALL XR) 15 MG 24 hr capsule Take 2 capsules by mouth every morning.   buPROPion (WELLBUTRIN XL) 150 MG 24 hr tablet Take 3 tablets (450 mg total) by mouth daily.    Cyanocobalamin (B-12) 1000 MCG SUBL Place 1 tablet under the tongue every Monday, Wednesday, and Friday.   dextroamphetamine (DEXTROSTAT) 10 MG tablet Take 2 tablets by mouth every morning and 2 tablets at noon   DULoxetine (CYMBALTA) 60 MG capsule Take 60 mg by mouth every morning.   Iron, Ferrous Sulfate, 325 (65 Fe) MG TABS Take 325 mg by mouth every Monday, Wednesday, and Friday.   Multiple Vitamins-Minerals (MULTIVITAL PO) Take by mouth.   naltrexone (DEPADE) 50 MG tablet TAKE 1 TABLET BY MOUTH ONCE DAILY   albuterol (VENTOLIN HFA) 108 (90 Base) MCG/ACT inhaler INHALE 2 PUFFS INTO THE LUNGS EVERY 6 HOURS AS NEEDED FOR WHEEZING OR SHORTNESS OF BREATH.   buPROPion (WELLBUTRIN XL) 150 MG 24 hr tablet Take 3 tablets by mouth every morning   buPROPion (WELLBUTRIN XL) 150 MG 24 hr tablet Take 3 tablets (450 mg total) by mouth in the morning.   Cholecalciferol (VITAMIN D3) 25 MCG (1000 UT) CAPS Take 1 capsule (1,000 Units total) by mouth daily. (Patient not taking: Reported on 01/08/2023)   dextroamphetamine (DEXTROSTAT) 10 MG tablet TAKE 2 TABLETS BY MOUTH ONCE EVERY MORNING AND AFTERNOON   dextroamphetamine (DEXTROSTAT) 10 MG tablet Take 2 tablets (20 mg total) by mouth in the morning and at noon.   DULoxetine (CYMBALTA) 60 MG capsule Take 1 capsule (60 mg total) by  mouth daily.   No facility-administered encounter medications on file as of 01/08/2023.    Allergies (verified) Patient has no known allergies.   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   Past  Surgical History:  Procedure Laterality Date   CHOLECYSTECTOMY  1995   COLONOSCOPY  2017   JP-MAC-suprep(good)-SSP   COLONOSCOPY  07/2021   5 TAs, rpt 3 yrs Henrene Pastor)   LAPAROSCOPIC GASTRIC SLEEVE RESECTION N/A 05/24/2014   Gayland Curry, MD   MOHS SURGERY     squamous cell on ear   NEUROPLASTY / TRANSPOSITION MEDIAN NERVE AT CARPAL TUNNEL BILATERAL Bilateral 1999   POLYPECTOMY  2017   [place]   UPPER GI ENDOSCOPY  05/24/2014   Procedure: UPPER GI ENDOSCOPY;  Surgeon: Gayland Curry, MD;  Location: [facility];  Service: General;;   Family History  Problem Relation Age of Onset   Heart disease Mother    Arthritis Mother    Heart attack Mother    High blood pressure Mother    Heart disease Father    Alcohol abuse Father    COPD Father    Heart attack Father    High blood pressure Father    Colon polyps Sister    Diabetes Sister    Hypertension Sister    Arthritis Sister    Heart disease Sister    Diabetes Sister    Hypertension Sister    Asthma Sister    COPD Sister    Heart disease Sister    High Cholesterol Sister    Melanoma Sister    Heart attack Maternal Grandfather    Diabetes Paternal Grandmother    Colon cancer Neg Hx    Esophageal cancer Neg Hx    Rectal cancer Neg Hx    Stomach cancer Neg Hx    Social History   Socioeconomic History   Marital status: Married    Spouse name: Not on file   Number of children: Not on file   Years of education: Not on file   Highest education level: Not on file  Occupational History   Not on file  Tobacco Use   Smoking status: Never   Smokeless tobacco: Never  Vaping Use   Vaping Use: Never used  Substance and Sexual Activity   Alcohol use: Yes    Alcohol/week: 2.0 standard drinks of alcohol    Types: 2 Standard drinks or equivalent per week    Comment: occ. 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[employer], front office    Act: no regular exercise   Diet: good water, fruits/vegetables daily    Social Determinants of Health   Financial Resource Strain: Low Risk  (01/08/2023)  Overall Financial Resource Strain (CARDIA)    Difficulty of Paying Living Expenses: Not hard at all  Food Insecurity: No Food Insecurity (01/08/2023)   Hunger Vital Sign    Worried About Running Out of Food in the Last Year: Never true    Ran Out of Food in the Last Year: Never true  Transportation Needs: No Transportation Needs (01/08/2023)   PRAPARE - Hydrologist (Medical): No    Lack of Transportation (Non-Medical): No  Physical Activity: Sufficiently Active (01/08/2023)   Exercise Vital Sign    Days of Exercise per Week: 7 days    Minutes of Exercise per Session: 30 min  Stress: No Stress Concern Present (01/08/2023)   [place]    Feeling of Stress : Not at all  Social Connections: Moderately Isolated (01/08/2023)   Social Connection and Isolation Panel [NHANES]    Frequency of Communication with Friends and Family: More than three times a week    Frequency of Social Gatherings with Friends and Family: More than three times a week    Attends Religious Services: Never    Marine scientist or Organizations: No    Attends Music therapist: Never    Marital Status: Married    Tobacco Counseling Counseling given: Not Answered   Clinical Intake:  Pre-visit preparation completed: Yes  Pain : No/denies pain     Diabetes: No  How often do you need to have someone help you when you read instructions, pamphlets, or other written materials from your doctor or pharmacy?: 1 - Never  Diabetic?no  Interpreter Needed?: No  Information entered by :: Charlott Rakes, LPN   Activities of Daily Living    01/08/2023     2:33 PM  In your present state of health, do you have any difficulty performing the following activities:  Hearing? 0  Vision? 0  Difficulty concentrating or making decisions? 0  Walking or climbing stairs? 0  Dressing or bathing? 0  Doing errands, shopping? 0  Preparing Food and eating ? N  Using the Toilet? N  In the past six months, have you accidently leaked urine? N  Do you have problems with loss of bowel control? N  Managing your Medications? N  Managing your Finances? N  Housekeeping or managing your Housekeeping? N    Patient Care Team: Ria Bush, MD as PCP - General (Family Medicine) Kennith Center, RD as Dietitian (Family Medicine)  Indicate any recent Medical Services you may have received from other than [facility] providers in the past year (date may be approximate).     Assessment:   This is a routine wellness examination for Seng.  Hearing/Vision screen Hearing Screening - Comments:: Pt denies any hearing issues  Vision Screening - Comments:: Pt follows up with Dr Sandre Kitty for annual eye exams   Dietary issues and exercise activities discussed: Current Exercise Habits: Home exercise routine, Type of exercise: walking, Frequency (Times/Week): 7   Goals Addressed             This Visit's Progress    Patient Stated       Get well and feeling better        Depression Screen    01/08/2023    2:31 PM 12/25/2021    2:58 PM 12/19/2020    4:42 PM 11/09/2019    4:41 PM 08/03/2019    4:06 PM 01/24/2016    8:32 AM  07/25/2015   12:03 PM  PHQ 2/9 Scores  PHQ - 2 Score 0 0 0 0 2 0 0  PHQ- 9 Score  0 4 6 8      $ Fall Risk    01/08/2023    2:33 PM 12/25/2021    2:16 PM 01/24/2016    8:32 AM 07/25/2015   12:03 PM 04/10/2015    5:30 PM  Fall Risk   Falls in the past year? 0 0 No No No  Number falls in past yr: 0      Injury with Fall? 0      Risk for fall due to : Impaired vision      Follow up Falls prevention discussed        FALL RISK PREVENTION  PERTAINING TO THE HOME:  Any stairs in or around the home? Yes  If so, are there any without handrails? No  Home free of loose throw rugs in walkways, pet beds, electrical cords, etc? Yes  Adequate lighting in your home to reduce risk of falls? Yes   ASSISTIVE DEVICES UTILIZED TO PREVENT FALLS:  Life alert? No  Use of a cane, walker or w/c? No  Grab bars in the bathroom? No  Shower chair or bench in shower? No  Elevated toilet seat or a handicapped toilet? No   TIMED UP AND GO:  Was the test performed? No .   Cognitive Function:        01/08/2023    2:34 PM  6CIT Screen  What Year? 0 points  What month? 0 points  What time? 0 points  Count back from 20 0 points  Months in reverse 0 points  Repeat phrase 0 points  Total Score 0 points    Immunizations Immunization History  Administered Date(s) Administered   Influenza Split 08/15/2011, 08/08/2014   Influenza, High Dose Seasonal PF 08/29/2020, 07/25/2021   Influenza,inj,Quad PF,6+ Mos 07/27/2019   Influenza-Unspecified 08/04/2017   PFIZER(Purple Top)SARS-COV-2 Vaccination 12/06/2019, 12/24/2019, 08/25/2020   Pneumococcal Polysaccharide-23 12/19/2020   Zoster Recombinat (Shingrix) 11/09/2019, 02/08/2020    TDAP status: Due, Education has been provided regarding the importance of this vaccine. Advised may receive this vaccine at local pharmacy or Health Dept. Aware to provide a copy of the vaccination record if obtained from local pharmacy or Health Dept. Verbalized acceptance and understanding.  Flu Vaccine status: Due, Education has been provided regarding the importance of this vaccine. Advised may receive this vaccine at local pharmacy or Health Dept. Aware to provide a copy of the vaccination record if obtained from local pharmacy or Health Dept. Verbalized acceptance and understanding.  Pneumococcal vaccine status: Due, Education has been provided regarding the importance of this vaccine. Advised may receive this  vaccine at local pharmacy or Health Dept. Aware to provide a copy of the vaccination record if obtained from local pharmacy or Health Dept. Verbalized acceptance and understanding.  Covid-19 vaccine status: Completed vaccines  Qualifies for Shingles Vaccine? Yes   Zostavax completed Yes   Shingrix Completed?: Yes  Screening Tests Health Maintenance  Topic Date Due   DTaP/Tdap/Td (1 - Tdap) Never done   Pneumonia Vaccine 63+ Years old (2 of 2 - PCV) 12/19/2021   INFLUENZA VACCINE  06/25/2022   COVID-19 Vaccine (4 - 2023-24 season) 07/26/2022   MAMMOGRAM  07/24/2023   Medicare Annual Wellness (AWV)  01/09/2024   COLONOSCOPY (Pts 45-64yr Insurance coverage will need to be confirmed)  08/08/2024   DEXA SCAN  Completed   Hepatitis  C Screening  Completed   Zoster Vaccines- Shingrix  Completed   HPV VACCINES  Aged Out    Health Maintenance  Health Maintenance Due  Topic Date Due   DTaP/Tdap/Td (1 - Tdap) Never done   Pneumonia Vaccine 37+ Years old (2 of 2 - PCV) 12/19/2021   INFLUENZA VACCINE  06/25/2022   COVID-19 Vaccine (4 - 2023-24 season) 07/26/2022    Colorectal cancer screening: Type of screening: Colonoscopy. Completed 08/08/21. Repeat every 3 years  Mammogram status: Completed 07/23/22. Repeat every year  Bone Density status: Completed 07/17/21. Results reflect: Bone density results: OSTEOPENIA. Repeat every 2 years.   Additional Screening:  Hepatitis C Screening:  Completed 11/03/19  Vision Screening: Recommended annual ophthalmology exams for early detection of glaucoma and other disorders of the eye. Is the patient up to date with their annual eye exam?  Yes  Who is the provider or what is the name of the office in which the patient attends annual eye exams? Dr Sandre Kitty  If pt is not established with a provider, would they like to be referred to a provider to establish care? No .   Dental Screening: Recommended annual dental exams for proper oral  hygiene  Community Resource Referral / Chronic Care Management: CRR required this visit?  No   CCM required this visit?  No      Plan:     I have personally reviewed and noted the following in the patient's chart:   Medical and social history Use of alcohol, tobacco or illicit drugs  Current medications and supplements including opioid prescriptions. Patient is not currently taking opioid prescriptions. Functional ability and status Nutritional status Physical activity Advanced directives List of other physicians Hospitalizations, surgeries, and ER visits in previous 12 months Vitals Screenings to include cognitive, depression, and falls Referrals and appointments  In addition, I have reviewed and discussed with patient certain preventive protocols, quality metrics, and best practice recommendations. A written personalized care plan for preventive services as well as general preventive health recommendations were provided to patient.     Willette Brace, LPN   [phone]   Nurse Notes: none

## 2023-01-14 ENCOUNTER — Other Ambulatory Visit (HOSPITAL_COMMUNITY): Payer: Self-pay

## 2023-01-30 ENCOUNTER — Telehealth: Payer: Self-pay | Admitting: Family Medicine

## 2023-01-30 NOTE — Telephone Encounter (Signed)
[employer] called on behalf of pt regarding a $445 bill the pt received after having the awv telephone call on 01/08/23.

## 2023-01-31 NOTE — Telephone Encounter (Signed)
Pt called requesting a call back from Amy @ [phone]

## 2023-02-03 NOTE — Telephone Encounter (Signed)
Patient was billed for her Medicare wellness visit $445 Can we find out why?  DOS: 2.14.24

## 2023-02-11 ENCOUNTER — Other Ambulatory Visit: Payer: Self-pay | Admitting: Family Medicine

## 2023-02-11 ENCOUNTER — Other Ambulatory Visit: Payer: Self-pay

## 2023-02-11 DIAGNOSIS — I10 Essential (primary) hypertension: Secondary | ICD-10-CM

## 2023-02-11 MED ORDER — AMLODIPINE BESYLATE 10 MG PO TABS
10.0000 mg | ORAL_TABLET | Freq: Every day | ORAL | 0 refills | Status: DC
  Filled 2023-02-11 – 2023-02-12 (×2): qty 90, 90d supply, fill #0

## 2023-02-12 ENCOUNTER — Other Ambulatory Visit (HOSPITAL_COMMUNITY): Payer: Self-pay

## 2023-02-12 ENCOUNTER — Other Ambulatory Visit: Payer: Self-pay

## 2023-02-17 ENCOUNTER — Other Ambulatory Visit (INDEPENDENT_AMBULATORY_CARE_PROVIDER_SITE_OTHER): Payer: Medicare Other

## 2023-02-17 DIAGNOSIS — E611 Iron deficiency: Secondary | ICD-10-CM | POA: Diagnosis not present

## 2023-02-17 DIAGNOSIS — I10 Essential (primary) hypertension: Secondary | ICD-10-CM

## 2023-02-17 DIAGNOSIS — Z9884 Bariatric surgery status: Secondary | ICD-10-CM

## 2023-02-17 DIAGNOSIS — E538 Deficiency of other specified B group vitamins: Secondary | ICD-10-CM | POA: Diagnosis not present

## 2023-02-17 DIAGNOSIS — M85851 Other specified disorders of bone density and structure, right thigh: Secondary | ICD-10-CM | POA: Diagnosis not present

## 2023-02-17 LAB — LIPID PANEL
Cholesterol: 206 mg/dL — ABNORMAL HIGH (ref 0–200)
HDL: 69.7 mg/dL (ref >39.00)
LDL Cholesterol: 122 mg/dL — ABNORMAL HIGH (ref 0–99)
NonHDL: 135.99
Total CHOL/HDL Ratio: 3
Triglycerides: 71 mg/dL (ref 0.0–149.0)
VLDL: 14.2 mg/dL (ref 0.0–40.0)

## 2023-02-17 LAB — MICROALBUMIN / CREATININE URINE RATIO
Creatinine,U: 276.3 mg/dL
Microalb Creat Ratio: 0.9 mg/g (ref 0.0–30.0)
Microalb, Ur: 2.4 mg/dL — ABNORMAL HIGH (ref 0.0–1.9)

## 2023-02-17 LAB — CBC WITH DIFFERENTIAL/PLATELET
Basophils Absolute: 0.1 10*3/uL (ref 0.0–0.1)
Basophils Relative: 1.1 % (ref 0.0–3.0)
Eosinophils Absolute: 0.2 10*3/uL (ref 0.0–0.7)
Eosinophils Relative: 2.2 % (ref 0.0–5.0)
HCT: 38.9 % (ref 36.0–46.0)
Hemoglobin: 12.8 g/dL (ref 12.0–15.0)
Lymphocytes Relative: 29.4 % (ref 12.0–46.0)
Lymphs Abs: 2.4 10*3/uL (ref 0.7–4.0)
MCHC: 32.8 g/dL (ref 30.0–36.0)
MCV: 86.3 fl (ref 78.0–100.0)
Monocytes Absolute: 0.8 10*3/uL (ref 0.1–1.0)
Monocytes Relative: 10.2 % (ref 3.0–12.0)
Neutro Abs: 4.7 10*3/uL (ref 1.4–7.7)
Neutrophils Relative %: 57.1 % (ref 43.0–77.0)
Platelets: 326 10*3/uL (ref 150.0–400.0)
RBC: 4.51 Mil/uL (ref 3.87–5.11)
RDW: 15.4 % (ref 11.5–15.5)
WBC: 8.2 10*3/uL (ref 4.0–10.5)

## 2023-02-17 LAB — COMPREHENSIVE METABOLIC PANEL
ALT: 13 U/L (ref 0–35)
AST: 14 U/L (ref 0–37)
Albumin: 4.3 g/dL (ref 3.5–5.2)
Alkaline Phosphatase: 65 U/L (ref 39–117)
BUN: 21 mg/dL (ref 6–23)
CO2: 29 mEq/L (ref 19–32)
Calcium: 9.5 mg/dL (ref 8.4–10.5)
Chloride: 102 mEq/L (ref 96–112)
Creatinine, Ser: 1.04 mg/dL (ref 0.40–1.20)
GFR: 55.49 mL/min — ABNORMAL LOW (ref >60.00)
Glucose, Bld: 87 mg/dL (ref 70–99)
Potassium: 4.3 mEq/L (ref 3.5–5.1)
Sodium: 141 mEq/L (ref 135–145)
Total Bilirubin: 0.5 mg/dL (ref 0.2–1.2)
Total Protein: 7 g/dL (ref 6.0–8.3)

## 2023-02-17 LAB — IBC PANEL
Iron: 62 ug/dL (ref 42–145)
Saturation Ratios: 14.1 % — ABNORMAL LOW (ref 20.0–50.0)
TIBC: 441 ug/dL (ref 250.0–450.0)
Transferrin: 315 mg/dL (ref 212.0–360.0)

## 2023-02-17 LAB — HEMOGLOBIN A1C: Hgb A1c MFr Bld: 5.6 % (ref 4.6–6.5)

## 2023-02-18 LAB — TSH: TSH: 2.77 u[IU]/mL (ref 0.35–5.50)

## 2023-02-18 LAB — VITAMIN D 25 HYDROXY (VIT D DEFICIENCY, FRACTURES): VITD: 22.12 ng/mL — ABNORMAL LOW (ref 30.00–100.00)

## 2023-02-18 LAB — FERRITIN: Ferritin: 6.8 ng/mL — ABNORMAL LOW (ref 10.0–291.0)

## 2023-02-18 LAB — VITAMIN B12: Vitamin B-12: 196 pg/mL — ABNORMAL LOW (ref 211–911)

## 2023-02-18 LAB — FOLATE: Folate: 8 ng/mL (ref >5.9)

## 2023-02-20 LAB — VITAMIN B1: Vitamin B1 (Thiamine): 11 nmol/L (ref 8–30)

## 2023-02-24 ENCOUNTER — Ambulatory Visit (INDEPENDENT_AMBULATORY_CARE_PROVIDER_SITE_OTHER): Payer: Medicare Other | Admitting: Family Medicine

## 2023-02-24 ENCOUNTER — Encounter: Payer: Self-pay | Admitting: Family Medicine

## 2023-02-24 VITALS — BP 124/74 | HR 90 | Temp 97.8°F | Ht 63.0 in | Wt 182.5 lb

## 2023-02-24 DIAGNOSIS — F988 Other specified behavioral and emotional disorders with onset usually occurring in childhood and adolescence: Secondary | ICD-10-CM

## 2023-02-24 DIAGNOSIS — Z0000 Encounter for general adult medical examination without abnormal findings: Secondary | ICD-10-CM

## 2023-02-24 DIAGNOSIS — F331 Major depressive disorder, recurrent, moderate: Secondary | ICD-10-CM

## 2023-02-24 DIAGNOSIS — K219 Gastro-esophageal reflux disease without esophagitis: Secondary | ICD-10-CM

## 2023-02-24 DIAGNOSIS — Z7189 Other specified counseling: Secondary | ICD-10-CM

## 2023-02-24 DIAGNOSIS — E669 Obesity, unspecified: Secondary | ICD-10-CM

## 2023-02-24 DIAGNOSIS — J4520 Mild intermittent asthma, uncomplicated: Secondary | ICD-10-CM

## 2023-02-24 DIAGNOSIS — E66811 Obesity, class 1: Secondary | ICD-10-CM

## 2023-02-24 DIAGNOSIS — I10 Essential (primary) hypertension: Secondary | ICD-10-CM

## 2023-02-24 DIAGNOSIS — E559 Vitamin D deficiency, unspecified: Secondary | ICD-10-CM

## 2023-02-24 DIAGNOSIS — Z23 Encounter for immunization: Secondary | ICD-10-CM | POA: Diagnosis not present

## 2023-02-24 DIAGNOSIS — E611 Iron deficiency: Secondary | ICD-10-CM

## 2023-02-24 DIAGNOSIS — Z9884 Bariatric surgery status: Secondary | ICD-10-CM

## 2023-02-24 DIAGNOSIS — E538 Deficiency of other specified B group vitamins: Secondary | ICD-10-CM

## 2023-02-24 DIAGNOSIS — M85851 Other specified disorders of bone density and structure, right thigh: Secondary | ICD-10-CM

## 2023-02-24 MED ORDER — ALBUTEROL SULFATE HFA 108 (90 BASE) MCG/ACT IN AERS
2.0000 | INHALATION_SPRAY | Freq: Four times a day (QID) | RESPIRATORY_TRACT | 1 refills | Status: AC | PRN
  Filled 2023-02-24: qty 6.7, 25d supply, fill #0

## 2023-02-24 MED ORDER — B-12 1000 MCG SL SUBL: 1.0000 | SUBLINGUAL_TABLET | Freq: Every day | SUBLINGUAL | Status: AC

## 2023-02-24 MED ORDER — AMLODIPINE BESYLATE 10 MG PO TABS
10.0000 mg | ORAL_TABLET | Freq: Every day | ORAL | 4 refills | Status: DC
  Filled 2023-02-24 – 2023-06-02 (×2): qty 90, 90d supply, fill #0
  Filled 2023-09-02: qty 90, 90d supply, fill #1
  Filled 2023-12-08: qty 90, 90d supply, fill #2

## 2023-02-24 NOTE — Assessment & Plan Note (Signed)
Regularly sees psychiatrist.  °

## 2023-02-24 NOTE — Assessment & Plan Note (Signed)
Followed by psychiatry 

## 2023-02-24 NOTE — Assessment & Plan Note (Signed)
Preventative protocols reviewed and updated unless pt declined. Discussed healthy diet and lifestyle.  

## 2023-02-24 NOTE — Assessment & Plan Note (Signed)
Persistent iron deficiency without anemia as evidenced by low ferritin and % sat - rec restart oral iron QOD replacement.

## 2023-02-24 NOTE — Telephone Encounter (Signed)
Dawn- Did we ever find out anything from billing about this patient's charge? She called back, and from what I can see it was coded as an initial medicare well visit, but she had a medicare well in 2023 as well....  Patient is inquiring and doesn't want to go to collections.  Please advise.  Thank you.

## 2023-02-24 NOTE — Assessment & Plan Note (Signed)
Continue to encourage healthy diet and lifestyle choices to affect sustainable weight loss.  °

## 2023-02-24 NOTE — Patient Instructions (Addendum)
[id number] today.  Bring [place] copy of your living will to update your chart.  Restart vitamin b12 1068mcg daily (dissolvable tablets), vitamin D 1000-2000 units daily, and iron 325mg  (65FE) every other day  Work on increased legumes and fiber in diet to improve cholesterol. Work on good calcium intake.  You are doing well today Return as needed or in 1 year for next physical.

## 2023-02-24 NOTE — Assessment & Plan Note (Signed)
Levels low - rec restart 1000-2000 IU daily.  

## 2023-02-24 NOTE — Assessment & Plan Note (Signed)
Chronic, stable on amlodipine 10mg daily.  

## 2023-02-24 NOTE — Assessment & Plan Note (Signed)
Rec regular weight bearing exercise, good calcium in diet and restart vit D supplementation.

## 2023-02-24 NOTE — Progress Notes (Signed)
Patient ID: AVAEH GASQUE, female    DOB: 04-06-1956, 67 y.o.   MRN: [id number]  This visit was conducted in person.  BP 124/74 (BP Location: Left Arm, Patient Position: Sitting)   Pulse 90   Temp 97.8 F (36.6 C) (Skin)   Ht 5\' 3"  (1.6 m)   Wt 182 lb 8 oz (82.8 kg)   SpO2 96%   BMI 32.33 kg/m    CC: CPE Subjective:   HPI: Laura Mccarthy is a 67 y.o. female presenting on 02/24/2023 for Annual Exam   Saw health advisor 12/2022 for initial medicare wellness visit. Note reviewed.   No results found.  [place] Office Visit from 02/24/2023 in [facility] [facility] [place]  PHQ-2 Total Score 0          02/24/2023    5:12 PM 01/08/2023    2:33 PM 12/25/2021    2:16 PM 01/24/2016    8:32 AM 07/25/2015   12:03 PM  Fall Risk   Falls in the past year? 0 0 0 No No  Number falls in past yr: 0 0     Injury with Fall? 0 0     Risk for fall due to :  Impaired vision     Follow up  Falls prevention discussed       Retired Sept 30, 2022. 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  COLONOSCOPY 07/2021 - 5 TAs, rpt 3 yrs Henrene Pastor)  Well woman with OBGYN Dr Talbert Nan, last seen 05/2021. Last normal pap 2019.  [facility]  DEXA 06/2021 - T -2.1 R femur - osteopenia, not at higher risk of bone fracture. H/o elevated vit D levels. Good calcium in diet (likely 1200mg /day). Walking daily with dog.  Lung cancer screening - not eligible Flu shot - yearly  [place] 11/2019 x2, booster 08/2020 Tetanus shot - unsure  Pneumovax - 11/2020, [id number] today  Shingrix - 10/2019, 01/2020 Advanced directive discussion - does not have. Discussed. New packet provided. Husband Richard then niece Burnis Medin would be HCPOA.  Seat belt use discussed Sunscreen use discussed. No changing moles on skin. 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 Interim medical history since our last visit reviewed. Allergies and medications reviewed and updated. Outpatient Medications Prior to Visit  Medication Sig Dispense Refill   acetaminophen (TYLENOL) 500 MG tablet Take 1,000 mg by mouth every 6 (six) hours as needed for mild pain or moderate pain.     amphetamine-dextroamphetamine (ADDERALL XR) 15 MG 24 hr capsule Take 2 capsules by mouth every morning.  0   buPROPion (WELLBUTRIN XL) 150 MG 24 hr tablet Take 3 tablets (450 mg total) by mouth in the morning. 270 tablet 3   Cholecalciferol (VITAMIN D3) 25 MCG (1000 UT) CAPS Take 1 capsule (1,000 Units total) by mouth daily. 30 capsule    dextroamphetamine (DEXTROSTAT) 10 MG tablet Take 2 tablets (20 mg total) by mouth in the morning and at noon. 360 tablet 0   DULoxetine (CYMBALTA)  60 MG capsule Take 60 mg by mouth every morning.     Iron, Ferrous Sulfate, 325 (65 Fe) MG TABS Take 325 mg by mouth every Monday, Wednesday, and Friday. 30 tablet    Multiple Vitamins-Minerals (MULTIVITAL PO) Take by mouth.     naltrexone (DEPADE) 50 MG tablet TAKE 1 TABLET BY MOUTH ONCE DAILY 90 tablet 4   albuterol (VENTOLIN HFA) 108 (90 Base) MCG/ACT inhaler INHALE 2 PUFFS INTO THE LUNGS EVERY 6 HOURS AS NEEDED FOR WHEEZING OR SHORTNESS OF BREATH. 8.5 g 1   amLODipine (NORVASC) 10 MG tablet Take 1 tablet (10 mg total) by mouth daily. 90 tablet 0   Cyanocobalamin (B-12) 1000 MCG SUBL Place 1 tablet under the tongue every Monday, Wednesday, and Friday.     buPROPion (WELLBUTRIN XL) 150 MG 24 hr tablet Take 3 tablets (450 mg total) by mouth daily. (Patient not taking: Reported on 02/24/2023)     buPROPion (WELLBUTRIN XL)  150 MG 24 hr tablet Take 3 tablets by mouth every morning (Patient not taking: Reported on 02/24/2023) 270 tablet 4   dextroamphetamine (DEXTROSTAT) 10 MG tablet Take 2 tablets by mouth every morning and 2 tablets at noon (Patient not taking: Reported on 02/24/2023) 360 tablet 0   dextroamphetamine (DEXTROSTAT) 10 MG tablet TAKE 2 TABLETS BY MOUTH ONCE EVERY MORNING AND AFTERNOON 360 tablet 0   DULoxetine (CYMBALTA) 60 MG capsule Take 1 capsule (60 mg total) by mouth daily. (Patient not taking: Reported on 02/24/2023) 90 capsule 4   No facility-administered medications prior to visit.     Per HPI unless specifically indicated in ROS section below Review of Systems  Constitutional:  Negative for activity change, appetite change, chills, fatigue, fever and unexpected weight change.  HENT:  Negative for hearing loss.   Eyes:  Negative for visual disturbance.  Respiratory:  Positive for cough (+GERD history). Negative for chest tightness, shortness of breath and wheezing.   Cardiovascular:  Negative for chest pain, palpitations and leg swelling.  Gastrointestinal:  Negative for abdominal distention, abdominal pain, blood in stool, constipation, diarrhea, nausea and vomiting.  Genitourinary:  Negative for difficulty urinating and hematuria.  Musculoskeletal:  Negative for arthralgias, myalgias and neck pain.  Skin:  Negative for rash.  Neurological:  Positive for headaches. Negative for dizziness, seizures and syncope.  Hematological:  Negative for adenopathy. Does not bruise/bleed easily.  Psychiatric/Behavioral:  Negative for dysphoric mood. The patient is not nervous/anxious.     Objective:  BP 124/74 (BP Location: Left Arm, Patient Position: Sitting)   Pulse 90   Temp 97.8 F (36.6 C) (Skin)   Ht 5\' 3"  (1.6 m)   Wt 182 lb 8 oz (82.8 kg)   SpO2 96%   BMI 32.33 kg/m   Wt Readings from Last 3 Encounters:  02/24/23 182 lb 8 oz (82.8 kg)  01/08/23 184 lb (83.5 kg)  12/25/21 184 lb 9 oz (83.7  kg)      Physical Exam Vitals and nursing note reviewed.  Constitutional:      Appearance: Normal appearance. She is not ill-appearing.  HENT:     Head: Normocephalic and atraumatic.     Right Ear: Tympanic membrane, ear canal and external ear normal. There is no impacted cerumen.     Left Ear: Tympanic membrane, ear canal and external ear normal. There is no impacted cerumen.     Mouth/Throat:     Mouth: Mucous membranes are moist.     Pharynx: Oropharynx is clear. No oropharyngeal  exudate or posterior oropharyngeal erythema.  Eyes:     General:        Right eye: No discharge.        Left eye: No discharge.     Extraocular Movements: Extraocular movements intact.     Conjunctiva/sclera: Conjunctivae normal.     Pupils: Pupils are equal, round, and reactive to light.  Neck:     Thyroid: No thyroid mass or thyromegaly.  Cardiovascular:     Rate and Rhythm: Normal rate and regular rhythm.     Pulses: Normal pulses.     Heart sounds: Normal heart sounds. No murmur heard. Pulmonary:     Effort: Pulmonary effort is normal. No respiratory distress.     Breath sounds: Normal breath sounds. No wheezing, rhonchi or rales.  Abdominal:     General: Bowel sounds are normal. There is no distension.     Palpations: Abdomen is soft. There is no mass.     Tenderness: There is no abdominal tenderness. There is no guarding or rebound.     Hernia: No hernia is present.  Musculoskeletal:     Cervical back: Normal range of motion and neck supple. No rigidity.     Right lower leg: No edema.     Left lower leg: No edema.  Lymphadenopathy:     Cervical: No cervical adenopathy.  Skin:    General: Skin is warm and dry.     Findings: No rash.  Neurological:     General: No focal deficit present.     Mental Status: She is alert. Mental status is at baseline.  Psychiatric:        Mood and Affect: Mood normal.        Behavior: Behavior normal.       Results for orders placed or performed in  visit on 02/17/23  Microalbumin / creatinine urine ratio  Result Value Ref Range   Microalb, Ur 2.4 (H) 0.0 - 1.9 mg/dL   Creatinine,U 276.3 mg/dL   Microalb Creat Ratio 0.9 0.0 - 30.0 mg/g  Folate  Result Value Ref Range   Folate 8.0 >5.9 ng/mL  IBC panel  Result Value Ref Range   Iron 62 42 - 145 ug/dL   Transferrin 315.0 212.0 - 360.0 mg/dL   Saturation Ratios 14.1 (L) 20.0 - 50.0 %   TIBC 441.0 250.0 - 450.0 mcg/dL  Ferritin  Result Value Ref Range   Ferritin 6.8 (L) 10.0 - 291.0 ng/mL  Vitamin B12  Result Value Ref Range   Vitamin B-12 196 (L) 211 - 911 pg/mL  CBC with Differential/Platelet  Result Value Ref Range   WBC 8.2 4.0 - 10.5 K/uL   RBC 4.51 3.87 - 5.11 Mil/uL   Hemoglobin 12.8 12.0 - 15.0 g/dL   HCT 38.9 36.0 - 46.0 %   MCV 86.3 78.0 - 100.0 fl   MCHC 32.8 30.0 - 36.0 g/dL   RDW 15.4 11.5 - 15.5 %   Platelets 326.0 150.0 - 400.0 K/uL   Neutrophils Relative % 57.1 43.0 - 77.0 %   Lymphocytes Relative 29.4 12.0 - 46.0 %   Monocytes Relative 10.2 3.0 - 12.0 %   Eosinophils Relative 2.2 0.0 - 5.0 %   Basophils Relative 1.1 0.0 - 3.0 %   Neutro Abs 4.7 1.4 - 7.7 K/uL   Lymphs Abs 2.4 0.7 - 4.0 K/uL   Monocytes Absolute 0.8 0.1 - 1.0 K/uL   Eosinophils Absolute 0.2 0.0 - 0.7 K/uL   Basophils Absolute  0.1 0.0 - 0.1 K/uL  Hemoglobin A1c  Result Value Ref Range   Hgb A1c MFr Bld 5.6 4.6 - 6.5 %  TSH  Result Value Ref Range   TSH 2.77 0.35 - 5.50 uIU/mL  Comprehensive metabolic panel  Result Value Ref Range   Sodium 141 135 - 145 mEq/L   Potassium 4.3 3.5 - 5.1 mEq/L   Chloride 102 96 - 112 mEq/L   CO2 29 19 - 32 mEq/L   Glucose, Bld 87 70 - 99 mg/dL   BUN 21 6 - 23 mg/dL   Creatinine, Ser 1.04 0.40 - 1.20 mg/dL   Total Bilirubin 0.5 0.2 - 1.2 mg/dL   Alkaline Phosphatase 65 39 - 117 U/L   AST 14 0 - 37 U/L   ALT 13 0 - 35 U/L   Total Protein 7.0 6.0 - 8.3 g/dL   Albumin 4.3 3.5 - 5.2 g/dL   GFR 55.49 (L) >60.00 mL/min   Calcium 9.5 8.4 - 10.5 mg/dL   Vitamin B1  Result Value Ref Range   Vitamin B1 (Thiamine) 11 8 - 30 nmol/L  VITAMIN D 25 Hydroxy (Vit-D Deficiency, Fractures)  Result Value Ref Range   VITD 22.12 (L) 30.00 - 100.00 ng/mL  Lipid panel  Result Value Ref Range   Cholesterol 206 (H) 0 - 200 mg/dL   Triglycerides 71.0 0.0 - 149.0 mg/dL   HDL 69.70 >39.00 mg/dL   VLDL 14.2 0.0 - 40.0 mg/dL   LDL Cholesterol 122 (H) 0 - 99 mg/dL   Total CHOL/HDL Ratio 3    NonHDL 135.99     Assessment & Plan:   Problem List Items Addressed This Visit     Health maintenance examination - Primary (Chronic)    Preventative protocols reviewed and updated unless pt declined. Discussed healthy diet and lifestyle.       Advanced directives, counseling/discussion (Chronic)    Asked to bring [place] advanced directive.       Asthma   Relevant Medications   albuterol (VENTOLIN HFA) 108 (90 Base) MCG/ACT inhaler   Obesity, Class I, BMI 30-34.9    Continue to encourage healthy diet and lifestyle choices to affect sustainable weight loss.       S/P laparoscopic sleeve gastrectomy   MDD (major depressive disorder), recurrent episode, moderate    Regularly sees psychiatrist      GERD (gastroesophageal reflux disease)    Discussed OTC pepcid vs stronger prilosec OTC use.       ADD (attention deficit disorder)    Followed by psychiatry.      Essential hypertension    Chronic, stable on amlodipine 10mg  daily.      Relevant Medications   amLODipine (NORVASC) 10 MG tablet   Vitamin D deficiency    Levels low - rec restart 1000-2000 IU daily.       Vitamin B12 deficiency    Levels low - rec restart vit B12 1083mcg SL dissolvable tablets daily       Iron deficiency    Persistent iron deficiency without anemia as evidenced by low ferritin and % sat - rec restart oral iron QOD replacement.       Osteopenia    Rec regular weight bearing exercise, good calcium in diet and restart vit D supplementation.       Other Visit  Diagnoses     Need for vaccination against Streptococcus pneumoniae       Relevant Orders   Pneumococcal conjugate vaccine 20-valent (Prevnar 20) (Completed)  Meds ordered this encounter  Medications   albuterol (VENTOLIN HFA) 108 (90 Base) MCG/ACT inhaler    Sig: INHALE 2 PUFFS INTO THE LUNGS EVERY 6 HOURS AS NEEDED FOR WHEEZING OR SHORTNESS OF BREATH.    Dispense:  8.5 g    Refill:  1   amLODipine (NORVASC) 10 MG tablet    Sig: Take 1 tablet (10 mg total) by mouth daily.    Dispense:  90 tablet    Refill:  4   Cyanocobalamin (B-12) 1000 MCG SUBL    Sig: Place 1 tablet under the tongue daily.    Dispense:  30 tablet    Orders Placed This Encounter  Procedures   Pneumococcal conjugate vaccine 20-valent (Prevnar 20)    Patient Instructions  [id number] today.  Bring [place] copy of your living will to update your chart.  Restart vitamin b12 1078mcg daily (dissolvable tablets), vitamin D 1000-2000 units daily, and iron 325mg  (65FE) every other day  Work on increased legumes and fiber in diet to improve cholesterol. Work on good calcium intake.  You are doing well today Return as needed or in 1 year for next physical.   Follow up plan: Return in about 1 year (around 02/24/2024) for annual exam, prior fasting for blood work, medicare wellness visit.  Ria Bush, MD

## 2023-02-24 NOTE — Assessment & Plan Note (Addendum)
Asked to bring [place] advanced directive.

## 2023-02-24 NOTE — Assessment & Plan Note (Signed)
Levels low - rec restart vit B12 1021mcg SL dissolvable tablets daily

## 2023-02-24 NOTE — Assessment & Plan Note (Signed)
Discussed OTC pepcid vs stronger prilosec OTC use.

## 2023-02-25 ENCOUNTER — Other Ambulatory Visit (HOSPITAL_COMMUNITY): Payer: Self-pay

## 2023-02-25 ENCOUNTER — Other Ambulatory Visit: Payer: Self-pay

## 2023-03-04 ENCOUNTER — Telehealth: Payer: Self-pay | Admitting: Family Medicine

## 2023-03-04 NOTE — Telephone Encounter (Signed)
Patient said she was talking to you about her bill. Called back regarding 2/14 wellness call, as well as a past due balance bill. ([employer]) saying insurance didn't pay.

## 2023-03-22 ENCOUNTER — Other Ambulatory Visit (HOSPITAL_COMMUNITY): Payer: Self-pay

## 2023-03-24 ENCOUNTER — Other Ambulatory Visit (HOSPITAL_COMMUNITY): Payer: Self-pay

## 2023-03-24 ENCOUNTER — Encounter (HOSPITAL_COMMUNITY): Payer: Self-pay

## 2023-04-25 ENCOUNTER — Other Ambulatory Visit (HOSPITAL_COMMUNITY): Payer: Self-pay

## 2023-04-25 DIAGNOSIS — H10812 Pingueculitis, left eye: Secondary | ICD-10-CM | POA: Diagnosis not present

## 2023-04-25 DIAGNOSIS — H40023 Open angle with borderline findings, high risk, bilateral: Secondary | ICD-10-CM | POA: Diagnosis not present

## 2023-04-25 DIAGNOSIS — H25813 Combined forms of age-related cataract, bilateral: Secondary | ICD-10-CM | POA: Diagnosis not present

## 2023-04-25 DIAGNOSIS — H40053 Ocular hypertension, bilateral: Secondary | ICD-10-CM | POA: Diagnosis not present

## 2023-04-25 MED ORDER — XIIDRA 5 % OP SOLN
1.0000 [drp] | Freq: Two times a day (BID) | OPHTHALMIC | 0 refills | Status: DC
Start: 2023-04-25 — End: 2024-03-11
  Filled 2023-04-25: qty 180, 90d supply, fill #0

## 2023-04-29 ENCOUNTER — Other Ambulatory Visit (HOSPITAL_COMMUNITY): Payer: Self-pay

## 2023-05-23 ENCOUNTER — Other Ambulatory Visit (HOSPITAL_COMMUNITY): Payer: Self-pay

## 2023-05-23 ENCOUNTER — Other Ambulatory Visit: Payer: Self-pay

## 2023-05-23 MED ORDER — NALTREXONE HCL 50 MG PO TABS
50.0000 mg | ORAL_TABLET | Freq: Every day | ORAL | 3 refills | Status: DC
  Filled 2023-05-23 – 2023-07-17 (×2): qty 90, 90d supply, fill #0
  Filled 2023-10-11: qty 90, 90d supply, fill #1
  Filled 2024-01-19: qty 90, 90d supply, fill #2
  Filled 2024-05-05: qty 90, 90d supply, fill #3

## 2023-05-23 MED ORDER — DEXTROAMPHETAMINE SULFATE 10 MG PO TABS
ORAL_TABLET | ORAL | 0 refills | Status: DC
  Filled 2023-05-23 – 2023-06-02 (×2): qty 360, 90d supply, fill #0

## 2023-05-26 ENCOUNTER — Other Ambulatory Visit (HOSPITAL_COMMUNITY): Payer: Self-pay

## 2023-05-26 ENCOUNTER — Other Ambulatory Visit: Payer: Self-pay

## 2023-05-27 ENCOUNTER — Other Ambulatory Visit: Payer: Self-pay

## 2023-05-27 ENCOUNTER — Other Ambulatory Visit (HOSPITAL_COMMUNITY): Payer: Self-pay

## 2023-05-28 ENCOUNTER — Other Ambulatory Visit: Payer: Self-pay

## 2023-05-28 ENCOUNTER — Encounter: Payer: Self-pay | Admitting: Pharmacist

## 2023-06-02 ENCOUNTER — Other Ambulatory Visit (HOSPITAL_COMMUNITY): Payer: Self-pay

## 2023-06-02 ENCOUNTER — Other Ambulatory Visit: Payer: Self-pay

## 2023-06-03 ENCOUNTER — Other Ambulatory Visit: Payer: Self-pay

## 2023-06-07 ENCOUNTER — Other Ambulatory Visit (HOSPITAL_COMMUNITY): Payer: Self-pay

## 2023-06-11 ENCOUNTER — Other Ambulatory Visit: Payer: Self-pay | Admitting: Family Medicine

## 2023-06-11 ENCOUNTER — Other Ambulatory Visit (HOSPITAL_COMMUNITY): Payer: Self-pay

## 2023-06-11 DIAGNOSIS — Z1231 Encounter for screening mammogram for malignant neoplasm of breast: Secondary | ICD-10-CM

## 2023-06-11 MED ORDER — DULOXETINE HCL 60 MG PO CPEP
60.0000 mg | ORAL_CAPSULE | Freq: Every day | ORAL | 1 refills | Status: DC
  Filled 2023-06-11: qty 90, 90d supply, fill #0
  Filled 2023-09-02: qty 90, 90d supply, fill #1

## 2023-06-20 NOTE — Telephone Encounter (Signed)
Updated message sent to patient re: billing

## 2023-07-03 ENCOUNTER — Other Ambulatory Visit (HOSPITAL_COMMUNITY): Payer: Self-pay

## 2023-07-09 ENCOUNTER — Other Ambulatory Visit (HOSPITAL_COMMUNITY): Payer: Self-pay

## 2023-07-17 ENCOUNTER — Other Ambulatory Visit: Payer: Self-pay

## 2023-07-23 ENCOUNTER — Other Ambulatory Visit: Payer: Self-pay

## 2023-07-29 ENCOUNTER — Other Ambulatory Visit (HOSPITAL_COMMUNITY): Payer: Self-pay

## 2023-07-30 ENCOUNTER — Ambulatory Visit: Admission: RE | Admit: 2023-07-30 | Payer: Medicare Other | Source: Ambulatory Visit

## 2023-07-30 DIAGNOSIS — Z1231 Encounter for screening mammogram for malignant neoplasm of breast: Secondary | ICD-10-CM

## 2023-08-04 ENCOUNTER — Other Ambulatory Visit: Payer: Self-pay

## 2023-08-15 ENCOUNTER — Other Ambulatory Visit: Payer: Self-pay

## 2023-09-02 ENCOUNTER — Other Ambulatory Visit (HOSPITAL_COMMUNITY): Payer: Self-pay

## 2023-09-10 ENCOUNTER — Encounter: Payer: Self-pay | Admitting: Family Medicine

## 2023-09-10 ENCOUNTER — Ambulatory Visit (INDEPENDENT_AMBULATORY_CARE_PROVIDER_SITE_OTHER): Payer: Medicare Other | Admitting: Family Medicine

## 2023-09-10 VITALS — BP 110/70 | HR 99 | Temp 99.1°F | Ht 63.0 in | Wt 185.5 lb

## 2023-09-10 DIAGNOSIS — M65351 Trigger finger, right little finger: Secondary | ICD-10-CM

## 2023-09-10 MED ORDER — TRIAMCINOLONE ACETONIDE 40 MG/ML IJ SUSP
20.0000 mg | Freq: Once | INTRAMUSCULAR | Status: AC
  Administered 2023-09-10: 20 mg via INTRA_ARTICULAR

## 2023-09-10 NOTE — Progress Notes (Signed)
Laura Mccarthy T. Janard Culp, MD, CAQ Sports Medicine [facility] [address] [place], [address]  Phone: [phone]  FAX: [phone]  Mccarthy Laura - 67 y.o. female  MRN [id number]  Date of Birth: 04-24-1956  Date: 09/10/2023  PCP: Eustaquio Boyden, MD  Referral: Eustaquio Boyden, MD  Chief Complaint  Patient presents with   Trigger Finger    Right Pinky Finger     Tendon Sheath Injection Procedure Note Laura Mccarthy 01/26/1956 Date of procedure: 09/10/2023  Procedure: Tendon Sheath Injection for Trigger Finger, R 5th Indications: Pain  Procedure Details Verbal consent was obtained. Risks (including potential risk for skin lightening and potential atrophy), benefits and alternatives were discussed. Prepped with Chloraprep and Ethyl Chloride used for anesthesia. Under sterile conditions, patient injected at palmar crease aiming distally with 45 degree angle towards nodule; injected directly into tendon sheath. Medication flowed freely without resistance.  Needle size: 22 gauge 1 1/2 inch Injection: 1/2 cc of Lidocaine 1% and Kenalog 20 mg Medication: 1/2 cc of Kenalog 40 mg (equaling Kenalog 20 mg)     ICD-10-CM   1. Trigger finger, right little finger  M65.351 triamcinolone acetonide (KENALOG-40) injection 20 mg      Medication Management during today's office visit: Meds ordered this encounter  Medications   triamcinolone acetonide (KENALOG-40) injection 20 mg   Medications Discontinued During This Encounter  Medication Reason   DULoxetine (CYMBALTA) 60 MG capsule Duplicate   naltrexone (DEPADE) 50 MG tablet Duplicate   dextroamphetamine (DEXTROSTAT) 10 MG tablet Duplicate    Orders placed today for conditions managed today: No orders of the defined types were placed in this encounter.   Disposition: No follow-ups on file.  [employer] speech-to-text software was used for transcription in this dictation.  Possible  transcriptional errors can occur using Animal nutritionist.   Signed,  Elpidio Galea. Trecia Maring, MD   Outpatient Encounter Medications as of 09/10/2023  Medication Sig   acetaminophen (TYLENOL) 500 MG tablet Take 1,000 mg by mouth every 6 (six) hours as needed for mild pain or moderate pain.   albuterol (VENTOLIN HFA) 108 (90 Base) MCG/ACT inhaler Inhale 2 puffs into the lungs every 6 (six) hours as needed for wheezing or shortness of breath.   amLODipine (NORVASC) 10 MG tablet Take 1 tablet (10 mg total) by mouth daily.   amphetamine-dextroamphetamine (ADDERALL XR) 15 MG 24 hr capsule Take 2 capsules by mouth every morning.   buPROPion (WELLBUTRIN XL) 150 MG 24 hr tablet Take 3 tablets (450 mg total) by mouth in the morning.   Cholecalciferol (VITAMIN D3) 25 MCG (1000 UT) CAPS Take 1 capsule (1,000 Units total) by mouth daily.   Cyanocobalamin (B-12) 1000 MCG SUBL Place 1 tablet under the tongue daily.   dextroamphetamine (DEXTROSTAT) 10 MG tablet Take 2 tablets (20 mg total) by mouth in the morning AND 2 tablets (20 mg total) daily at 12 noon.   DULoxetine (CYMBALTA) 60 MG capsule Take 1 capsule (60 mg total) by mouth daily.   Iron, Ferrous Sulfate, 325 (65 Fe) MG TABS Take 325 mg by mouth every Monday, Wednesday, and Friday.   Multiple Vitamins-Minerals (MULTIVITAL PO) Take by mouth.   naltrexone (DEPADE) 50 MG tablet Take 1 tablet (50 mg total) by mouth daily.   XIIDRA 5 % SOLN Place 1 drop into both eyes 2 (two) times daily.   [DISCONTINUED] dextroamphetamine (DEXTROSTAT) 10 MG tablet Take 2 tablets (20 mg total) by mouth in  the morning and at noon.   [DISCONTINUED] DULoxetine (CYMBALTA) 60 MG capsule Take 60 mg by mouth every morning.   [DISCONTINUED] naltrexone (DEPADE) 50 MG tablet TAKE 1 TABLET BY MOUTH ONCE DAILY   [EXPIRED] triamcinolone acetonide (KENALOG-40) injection 20 mg    No facility-administered encounter medications on file as of 09/10/2023.

## 2023-10-11 ENCOUNTER — Other Ambulatory Visit (HOSPITAL_COMMUNITY): Payer: Self-pay

## 2023-10-11 MED ORDER — DEXTROAMPHETAMINE SULFATE 10 MG PO TABS
ORAL_TABLET | ORAL | 0 refills | Status: AC
  Filled 2023-10-11: qty 360, 90d supply, fill #0

## 2023-10-13 ENCOUNTER — Other Ambulatory Visit: Payer: Self-pay

## 2023-10-14 ENCOUNTER — Other Ambulatory Visit (HOSPITAL_COMMUNITY): Payer: Self-pay

## 2023-11-17 ENCOUNTER — Other Ambulatory Visit: Payer: Self-pay

## 2023-11-17 ENCOUNTER — Other Ambulatory Visit (HOSPITAL_COMMUNITY): Payer: Self-pay

## 2023-11-17 ENCOUNTER — Other Ambulatory Visit (HOSPITAL_BASED_OUTPATIENT_CLINIC_OR_DEPARTMENT_OTHER): Payer: Self-pay

## 2023-11-17 MED ORDER — BUPROPION HCL ER (XL) 150 MG PO TB24
450.0000 mg | ORAL_TABLET | Freq: Every morning | ORAL | 3 refills | Status: DC
  Filled 2023-11-17 – 2024-01-07 (×2): qty 270, 90d supply, fill #0
  Filled 2024-04-06: qty 270, 90d supply, fill #1
  Filled 2024-07-05 – 2024-07-30 (×2): qty 270, 90d supply, fill #2
  Filled 2024-10-26: qty 270, 90d supply, fill #3

## 2023-11-17 MED ORDER — DULOXETINE HCL 60 MG PO CPEP
60.0000 mg | ORAL_CAPSULE | Freq: Every day | ORAL | 3 refills | Status: DC
  Filled 2023-11-17: qty 90, 90d supply, fill #0
  Filled 2024-03-03: qty 90, 90d supply, fill #1
  Filled 2024-06-04: qty 90, 90d supply, fill #2
  Filled 2024-09-05: qty 90, 90d supply, fill #3

## 2023-12-09 ENCOUNTER — Other Ambulatory Visit: Payer: Self-pay

## 2023-12-14 NOTE — Progress Notes (Unsigned)
   Romesha Scherer T. Kordae Buonocore, MD, CAQ Sports Medicine [facility] [address] [place], [address]  Phone: [phone]  FAX: [phone]  Laura Mccarthy - 68 y.o. female  MRN [id number]  Date of Birth: 1956-09-12  Date: 12/15/2023  PCP: Eustaquio Boyden, MD  Referral: Eustaquio Boyden, MD  No chief complaint on file.  Subjective:   Laura Mccarthy is a 68 y.o. very pleasant female patient with There is no height or weight on file to calculate BMI. who presents with the following:  Patient presents with ongoing trigger finger.    Review of Systems is noted in the HPI, as appropriate  Objective:   There were no vitals taken for this visit.  GEN: No acute distress; alert,appropriate. PULM: Breathing comfortably in no respiratory distress PSYCH: Normally interactive.   Laboratory and Imaging Data:  Assessment and Plan:   ***

## 2023-12-15 ENCOUNTER — Ambulatory Visit (INDEPENDENT_AMBULATORY_CARE_PROVIDER_SITE_OTHER): Payer: Medicare Other | Admitting: Family Medicine

## 2023-12-15 VITALS — BP 110/80 | HR 99 | Temp 98.2°F | Ht 63.0 in | Wt 184.4 lb

## 2023-12-15 DIAGNOSIS — M65351 Trigger finger, right little finger: Secondary | ICD-10-CM | POA: Diagnosis not present

## 2023-12-15 MED ORDER — TRIAMCINOLONE ACETONIDE 40 MG/ML IJ SUSP
20.0000 mg | Freq: Once | INTRAMUSCULAR | Status: AC
  Administered 2023-12-15: 20 mg via INTRA_ARTICULAR

## 2023-12-16 ENCOUNTER — Encounter: Payer: Self-pay | Admitting: Family Medicine

## 2023-12-24 DIAGNOSIS — H10812 Pingueculitis, left eye: Secondary | ICD-10-CM | POA: Diagnosis not present

## 2023-12-24 DIAGNOSIS — H524 Presbyopia: Secondary | ICD-10-CM | POA: Diagnosis not present

## 2023-12-24 DIAGNOSIS — H40023 Open angle with borderline findings, high risk, bilateral: Secondary | ICD-10-CM | POA: Diagnosis not present

## 2023-12-24 DIAGNOSIS — H40053 Ocular hypertension, bilateral: Secondary | ICD-10-CM | POA: Diagnosis not present

## 2023-12-24 DIAGNOSIS — H25813 Combined forms of age-related cataract, bilateral: Secondary | ICD-10-CM | POA: Diagnosis not present

## 2023-12-24 DIAGNOSIS — H52223 Regular astigmatism, bilateral: Secondary | ICD-10-CM | POA: Diagnosis not present

## 2024-01-07 ENCOUNTER — Other Ambulatory Visit: Payer: Self-pay

## 2024-01-07 ENCOUNTER — Other Ambulatory Visit (HOSPITAL_COMMUNITY): Payer: Self-pay

## 2024-01-15 ENCOUNTER — Other Ambulatory Visit (HOSPITAL_COMMUNITY): Payer: Self-pay

## 2024-01-19 ENCOUNTER — Other Ambulatory Visit (HOSPITAL_COMMUNITY): Payer: Self-pay

## 2024-02-23 ENCOUNTER — Other Ambulatory Visit: Payer: Self-pay | Admitting: Family Medicine

## 2024-02-23 DIAGNOSIS — E611 Iron deficiency: Secondary | ICD-10-CM

## 2024-02-23 DIAGNOSIS — E559 Vitamin D deficiency, unspecified: Secondary | ICD-10-CM

## 2024-02-23 DIAGNOSIS — Z9884 Bariatric surgery status: Secondary | ICD-10-CM

## 2024-02-23 DIAGNOSIS — E538 Deficiency of other specified B group vitamins: Secondary | ICD-10-CM

## 2024-02-24 ENCOUNTER — Other Ambulatory Visit: Payer: Medicare Other

## 2024-02-24 DIAGNOSIS — E559 Vitamin D deficiency, unspecified: Secondary | ICD-10-CM

## 2024-02-24 DIAGNOSIS — Z9884 Bariatric surgery status: Secondary | ICD-10-CM

## 2024-02-24 DIAGNOSIS — E538 Deficiency of other specified B group vitamins: Secondary | ICD-10-CM

## 2024-02-24 DIAGNOSIS — E611 Iron deficiency: Secondary | ICD-10-CM | POA: Diagnosis not present

## 2024-02-24 LAB — IBC PANEL
Iron: 60 ug/dL (ref 42–145)
Saturation Ratios: 14.9 % — ABNORMAL LOW (ref 20.0–50.0)
TIBC: 401.8 ug/dL (ref 250.0–450.0)
Transferrin: 287 mg/dL (ref 212.0–360.0)

## 2024-02-24 LAB — CBC WITH DIFFERENTIAL/PLATELET
Basophils Absolute: 0.1 10*3/uL (ref 0.0–0.1)
Basophils Relative: 1.1 % (ref 0.0–3.0)
Eosinophils Absolute: 0.2 10*3/uL (ref 0.0–0.7)
Eosinophils Relative: 2.9 % (ref 0.0–5.0)
HCT: 38.5 % (ref 36.0–46.0)
Hemoglobin: 12.5 g/dL (ref 12.0–15.0)
Lymphocytes Relative: 32.8 % (ref 12.0–46.0)
Lymphs Abs: 2 10*3/uL (ref 0.7–4.0)
MCHC: 32.4 g/dL (ref 30.0–36.0)
MCV: 87.7 fl (ref 78.0–100.0)
Monocytes Absolute: 0.6 10*3/uL (ref 0.1–1.0)
Monocytes Relative: 9.7 % (ref 3.0–12.0)
Neutro Abs: 3.3 10*3/uL (ref 1.4–7.7)
Neutrophils Relative %: 53.5 % (ref 43.0–77.0)
Platelets: 283 10*3/uL (ref 150.0–400.0)
RBC: 4.38 Mil/uL (ref 3.87–5.11)
RDW: 14.9 % (ref 11.5–15.5)
WBC: 6.1 10*3/uL (ref 4.0–10.5)

## 2024-02-24 LAB — COMPREHENSIVE METABOLIC PANEL WITH GFR
ALT: 11 U/L (ref 0–35)
AST: 13 U/L (ref 0–37)
Albumin: 4 g/dL (ref 3.5–5.2)
Alkaline Phosphatase: 67 U/L (ref 39–117)
BUN: 18 mg/dL (ref 6–23)
CO2: 31 meq/L (ref 19–32)
Calcium: 9.1 mg/dL (ref 8.4–10.5)
Chloride: 103 meq/L (ref 96–112)
Creatinine, Ser: 0.95 mg/dL (ref 0.40–1.20)
GFR: 61.41 mL/min (ref >60.00)
Glucose, Bld: 85 mg/dL (ref 70–99)
Potassium: 4.4 meq/L (ref 3.5–5.1)
Sodium: 141 meq/L (ref 135–145)
Total Bilirubin: 0.5 mg/dL (ref 0.2–1.2)
Total Protein: 6.5 g/dL (ref 6.0–8.3)

## 2024-02-24 LAB — VITAMIN D 25 HYDROXY (VIT D DEFICIENCY, FRACTURES): VITD: 26.63 ng/mL — ABNORMAL LOW (ref 30.00–100.00)

## 2024-02-24 LAB — LIPID PANEL
Cholesterol: 191 mg/dL (ref 0–200)
HDL: 68 mg/dL (ref >39.00)
LDL Cholesterol: 112 mg/dL — ABNORMAL HIGH (ref 0–99)
NonHDL: 122.96
Total CHOL/HDL Ratio: 3
Triglycerides: 54 mg/dL (ref 0.0–149.0)
VLDL: 10.8 mg/dL (ref 0.0–40.0)

## 2024-02-24 LAB — HEMOGLOBIN A1C: Hgb A1c MFr Bld: 5.4 % (ref 4.6–6.5)

## 2024-02-24 LAB — TSH: TSH: 2.42 u[IU]/mL (ref 0.35–5.50)

## 2024-02-24 LAB — FOLATE: Folate: 11 ng/mL (ref >5.9)

## 2024-02-24 LAB — VITAMIN B12: Vitamin B-12: 220 pg/mL (ref 211–911)

## 2024-02-24 LAB — FERRITIN: Ferritin: 6.1 ng/mL — ABNORMAL LOW (ref 10.0–291.0)

## 2024-02-29 LAB — VITAMIN B1: Vitamin B1 (Thiamine): 12 nmol/L (ref 8–30)

## 2024-03-02 ENCOUNTER — Encounter: Payer: Medicare Other | Admitting: Family Medicine

## 2024-03-03 ENCOUNTER — Other Ambulatory Visit: Payer: Self-pay | Admitting: Family Medicine

## 2024-03-03 ENCOUNTER — Other Ambulatory Visit: Payer: Self-pay

## 2024-03-03 ENCOUNTER — Other Ambulatory Visit (HOSPITAL_COMMUNITY): Payer: Self-pay

## 2024-03-03 DIAGNOSIS — I10 Essential (primary) hypertension: Secondary | ICD-10-CM

## 2024-03-03 MED ORDER — AMLODIPINE BESYLATE 10 MG PO TABS
10.0000 mg | ORAL_TABLET | Freq: Every day | ORAL | 0 refills | Status: DC
  Filled 2024-03-03: qty 90, 90d supply, fill #0

## 2024-03-04 ENCOUNTER — Other Ambulatory Visit: Payer: Self-pay

## 2024-03-11 ENCOUNTER — Ambulatory Visit (INDEPENDENT_AMBULATORY_CARE_PROVIDER_SITE_OTHER): Admitting: Family Medicine

## 2024-03-11 ENCOUNTER — Other Ambulatory Visit: Payer: Self-pay

## 2024-03-11 ENCOUNTER — Encounter: Payer: Self-pay | Admitting: Family Medicine

## 2024-03-11 ENCOUNTER — Other Ambulatory Visit (HOSPITAL_COMMUNITY): Payer: Self-pay

## 2024-03-11 VITALS — BP 134/72 | HR 95 | Temp 98.8°F | Ht 62.5 in | Wt 182.1 lb

## 2024-03-11 DIAGNOSIS — M85851 Other specified disorders of bone density and structure, right thigh: Secondary | ICD-10-CM

## 2024-03-11 DIAGNOSIS — E66811 Obesity, class 1: Secondary | ICD-10-CM

## 2024-03-11 DIAGNOSIS — Z7189 Other specified counseling: Secondary | ICD-10-CM

## 2024-03-11 DIAGNOSIS — E538 Deficiency of other specified B group vitamins: Secondary | ICD-10-CM

## 2024-03-11 DIAGNOSIS — R011 Cardiac murmur, unspecified: Secondary | ICD-10-CM

## 2024-03-11 DIAGNOSIS — Z808 Family history of malignant neoplasm of other organs or systems: Secondary | ICD-10-CM

## 2024-03-11 DIAGNOSIS — E611 Iron deficiency: Secondary | ICD-10-CM | POA: Diagnosis not present

## 2024-03-11 DIAGNOSIS — Z0000 Encounter for general adult medical examination without abnormal findings: Secondary | ICD-10-CM | POA: Diagnosis not present

## 2024-03-11 DIAGNOSIS — E559 Vitamin D deficiency, unspecified: Secondary | ICD-10-CM

## 2024-03-11 DIAGNOSIS — F988 Other specified behavioral and emotional disorders with onset usually occurring in childhood and adolescence: Secondary | ICD-10-CM

## 2024-03-11 DIAGNOSIS — I10 Essential (primary) hypertension: Secondary | ICD-10-CM | POA: Diagnosis not present

## 2024-03-11 DIAGNOSIS — Z9889 Other specified postprocedural states: Secondary | ICD-10-CM

## 2024-03-11 DIAGNOSIS — Z9884 Bariatric surgery status: Secondary | ICD-10-CM

## 2024-03-11 DIAGNOSIS — H409 Unspecified glaucoma: Secondary | ICD-10-CM

## 2024-03-11 DIAGNOSIS — K219 Gastro-esophageal reflux disease without esophagitis: Secondary | ICD-10-CM

## 2024-03-11 DIAGNOSIS — F331 Major depressive disorder, recurrent, moderate: Secondary | ICD-10-CM

## 2024-03-11 MED ORDER — OMEPRAZOLE 40 MG PO CPDR
40.0000 mg | DELAYED_RELEASE_CAPSULE | Freq: Every day | ORAL | 3 refills | Status: DC
  Filled 2024-03-11: qty 30, 30d supply, fill #0
  Filled 2024-04-05: qty 30, 30d supply, fill #1
  Filled 2024-05-05: qty 30, 30d supply, fill #2
  Filled 2024-05-15 – 2024-06-04 (×3): qty 30, 30d supply, fill #3

## 2024-03-11 MED ORDER — BARIATRIC MULTIVITAMINS PO CAPS: 1.0000 | ORAL_CAPSULE | Freq: Every day | ORAL | Status: AC

## 2024-03-11 MED ORDER — AMLODIPINE BESYLATE 10 MG PO TABS
10.0000 mg | ORAL_TABLET | Freq: Every day | ORAL | 4 refills | Status: AC
  Filled 2024-03-11 – 2024-06-04 (×2): qty 90, 90d supply, fill #0
  Filled 2024-09-05: qty 90, 90d supply, fill #1
  Filled 2024-12-17: qty 90, 90d supply, fill #2

## 2024-03-11 MED ORDER — CYANOCOBALAMIN 1000 MCG/ML IJ SOLN
1000.0000 ug | Freq: Once | INTRAMUSCULAR | Status: AC
  Administered 2024-03-11: 1000 ug via INTRAMUSCULAR

## 2024-03-11 NOTE — Assessment & Plan Note (Addendum)
 Preventative protocols reviewed and updated unless pt declined. Discussed healthy diet and lifestyle.  Rec schedule GYN exam.

## 2024-03-11 NOTE — Assessment & Plan Note (Addendum)
 Levels remain low normal s/p sleeve gastrectomy.  B12 shot today Rec restart daily vit B12 replacement or bariatric MVI with B12.

## 2024-03-11 NOTE — Assessment & Plan Note (Signed)
%   sat and ferritin remains low.  Rec restart MWF oral iron replacement or bariatric MVI with iron.

## 2024-03-11 NOTE — Assessment & Plan Note (Signed)
Followed by psychiatrist.  

## 2024-03-11 NOTE — Assessment & Plan Note (Signed)
 S/p sleeve gastrectomy 2015 Continue to encourage healthy diet and lifestyle choices to affect sustainable weight loss.

## 2024-03-11 NOTE — Assessment & Plan Note (Signed)
 Refer to derm to re establish care Also has sister with h/o melanoma

## 2024-03-11 NOTE — Assessment & Plan Note (Signed)
 Encouraged regular weight bearing exercise, good dietary calcium intake, regular  vit D supplementation

## 2024-03-11 NOTE — Assessment & Plan Note (Signed)
 Rec restart 1000 units daily.

## 2024-03-11 NOTE — Assessment & Plan Note (Signed)
 Chronic, stable period on amlodipine 10mg  daily - continue

## 2024-03-11 NOTE — Assessment & Plan Note (Signed)
 Followed by psychiatry

## 2024-03-11 NOTE — Assessment & Plan Note (Signed)
 Advanced directive discussion - does not have. Discussed. Packet previously provided. Husband Richard then niece Aimee Sharp would be HCPOA.

## 2024-03-11 NOTE — Assessment & Plan Note (Signed)

## 2024-03-11 NOTE — Assessment & Plan Note (Signed)
 Again heard today, mild, pt asxs. Will continue to monitor. No prior echocardiogram

## 2024-03-11 NOTE — Patient Instructions (Addendum)
 Start omeprazole 40mg  daily - sent to pharmacy.  B12 shot today  Start bariatric multivitamin.  Continue vitamin D 1000 units daily as your levels remain low.  You will be due for colonoscopy 07/2024.  Schedule GYN exam.  We will refer you to dermatology Fleurette Humbles) Good to see you today Return as needed or in 1 year for next physical.

## 2024-03-11 NOTE — Assessment & Plan Note (Addendum)
Regularly sees eye doctor.  

## 2024-03-11 NOTE — Progress Notes (Signed)
 Ph: [phone] Fax: [phone]   Patient ID: Laura Mccarthy, female    DOB: 06-11-1956, 68 y.o.   MRN: [id number]  This visit was conducted in person.  BP 134/72   Pulse 95   Temp 98.8 F (37.1 C) (Oral)   Ht 5' 2.5" (1.588 m)   Wt 182 lb 2 oz (82.6 kg)   SpO2 98%   BMI 32.78 kg/m    CC: CPE/AMW Subjective:   HPI: Laura Mccarthy is a 68 y.o. female presenting on 03/11/2024 for Medicare Wellness   Did not see health advisor this year.  Sister passed away 2023-06-10 from Alzheimer's at age 46yo   Hearing Screening   500Hz  1000Hz  2000Hz  4000Hz   Right ear 25 25 25 25   Left ear 25 25 25 25   Vision Screening - Comments:: Last eye exam, 12/2023.  Flowsheet Row Office Visit from 03/11/2024 in [facility] [place]  PHQ-2 Total Score 0          03/11/2024   11:17 AM 02/24/2023    5:12 PM 01/08/2023    2:33 PM 12/25/2021    2:16 PM 01/24/2016    8:32 AM  Fall Risk   Falls in the past year? 0 0 0 0 No  Number falls in past yr:  0 0    Injury with Fall?  0 0    Risk for fall due to :   Impaired vision    Follow up   Falls prevention discussed     H/o remote Mohs surgery for squamous cell cancer (Dr Joseph Art)   Fully retired Sept 30, 2022.   Notes worsening GERD previously managed with PPI. Pepcid nightly isn't as effective. Points to throat. No dysphagia, unexpected weight loss, early satiety. Remote EGD.   Preventative: Colonoscopy 11/2015 - serrated adenoma, rpt 3 yrs Marina Goodell).  COLONOSCOPY 07/2021 - 5 TAs, rpt 3 yrs Marina Goodell)  Well woman with OBGYN Dr Oscar La, last seen 05/2021. Last normal pap 2019.  [facility]  DEXA 06/2021 - T -2.1 R femur - osteopenia, not at higher risk of bone fracture. Good calcium in diet (likely 1200mg /day). Walking daily with dog.  Lung cancer screening - not eligible Flu shot - yearly  COVID vaccine Pfizer 11/2019 x2, booster 08/2020 Tetanus shot - unsure  Pneumovax - 11/2020, prevnar-20  02/2023 Shingrix - 10/2019, 01/2020 Advanced directive discussion - does not have set up. Packet previously provided. Husband Richard then niece Soyla Murphy would be HCPOA.  Seat belt use discussed Sunscreen use discussed. No changing moles on skin. Due for derm f/u.  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 Interim medical history since our last visit reviewed. Allergies and medications reviewed and updated. Outpatient Medications Prior to Visit  Medication Sig Dispense Refill   acetaminophen (TYLENOL) 500 MG tablet Take 1,000 mg by mouth every 6 (six) hours as needed for mild pain or moderate pain.     albuterol (VENTOLIN HFA) 108 (90 Base) MCG/ACT inhaler Inhale 2 puffs into the lungs every 6 (six) hours as needed for wheezing or shortness of  breath. 6.7 g 1   amphetamine-dextroamphetamine (ADDERALL XR) 15 MG 24 hr capsule Take 2 capsules by mouth every morning.  0   Bimatoprost (LUMIGAN OP) Apply to eye.     buPROPion (WELLBUTRIN XL) 150 MG 24 hr tablet Take 3 tablets (450 mg total) by mouth in the morning. 270 tablet 3   Cholecalciferol (VITAMIN D3) 25 MCG (1000 UT) CAPS Take 1 capsule (1,000 Units total) by mouth daily. 30 capsule    Cyanocobalamin (B-12) 1000 MCG SUBL Place 1 tablet under the tongue daily. 30 tablet    dextroamphetamine (DEXTROSTAT) 10 MG tablet Take 2 tablets (20 mg total) by mouth in the morning and 2 tablets (20 mg total) daily at 12 noon. 360 tablet 0   DULoxetine (CYMBALTA) 60 MG capsule Take 1 capsule (60 mg total) by mouth daily. 90 capsule 3   Iron, Ferrous Sulfate, 325 (65 Fe) MG TABS Take 325 mg by mouth  every Monday, Wednesday, and Friday. 30 tablet    naltrexone (DEPADE) 50 MG tablet Take 1 tablet (50 mg total) by mouth daily. 90 tablet 3   amLODipine (NORVASC) 10 MG tablet Take 1 tablet (10 mg total) by mouth daily. 90 tablet 0   Multiple Vitamins-Minerals (MULTIVITAL PO) Take by mouth.     XIIDRA 5 % SOLN Place 1 drop into both eyes 2 (two) times daily. 180 each 0   No facility-administered medications prior to visit.     Per HPI unless specifically indicated in ROS section below Review of Systems  Constitutional:  Negative for activity change, appetite change, chills, fatigue, fever and unexpected weight change.  HENT:  Negative for hearing loss.   Eyes:  Negative for visual disturbance.  Respiratory:  Positive for cough (dry, chronic, attributes to reflux). Negative for chest tightness, shortness of breath and wheezing.   Cardiovascular:  Negative for chest pain, palpitations and leg swelling.  Gastrointestinal:  Negative for abdominal distention, abdominal pain, blood in stool, constipation, diarrhea, nausea and vomiting.  Genitourinary:  Negative for difficulty urinating and hematuria.  Musculoskeletal:  Negative for arthralgias, myalgias and neck pain.  Skin:  Negative for rash.  Neurological:  Positive for headaches (occ). Negative for dizziness, seizures and syncope.  Hematological:  Negative for adenopathy. Does not bruise/bleed easily.  Psychiatric/Behavioral:  Negative for dysphoric mood. The patient is not nervous/anxious.     Objective:  BP 134/72   Pulse 95   Temp 98.8 F (37.1 C) (Oral)   Ht 5' 2.5" (1.588 m)   Wt 182 lb 2 oz (82.6 kg)   SpO2 98%   BMI 32.78 kg/m   Wt Readings from Last 3 Encounters:  03/11/24 182 lb 2 oz (82.6 kg)  12/15/23 184 lb 6 oz (83.6 kg)  09/10/23 185 lb 8 oz (84.1 kg)      Physical Exam Vitals and nursing note reviewed.  Constitutional:      Appearance: Normal appearance. She is not ill-appearing.  HENT:     Head:  Normocephalic and atraumatic.     Right Ear: Tympanic membrane, ear canal and external ear normal. There is no impacted cerumen.     Left Ear: Tympanic membrane, ear canal and external ear normal. There is no impacted cerumen.     Mouth/Throat:     Mouth: Mucous membranes are moist.     Pharynx: Oropharynx is clear. No oropharyngeal exudate or posterior oropharyngeal erythema.  Eyes:     General:        Right eye: No  discharge.        Left eye: No discharge.     Extraocular Movements: Extraocular movements intact.     Conjunctiva/sclera: Conjunctivae normal.     Pupils: Pupils are equal, round, and reactive to light.  Neck:     Thyroid: No thyroid mass or thyromegaly.     Vascular: No carotid bruit.  Cardiovascular:     Rate and Rhythm: Normal rate and regular rhythm.     Pulses: Normal pulses.     Heart sounds: Murmur (3/6 systolic USB) heard.  Pulmonary:     Effort: Pulmonary effort is normal. No respiratory distress.     Breath sounds: Normal breath sounds. No wheezing, rhonchi or rales.  Abdominal:     General: Bowel sounds are normal. There is no distension.     Palpations: Abdomen is soft. There is no mass.     Tenderness: There is no abdominal tenderness. There is no guarding or rebound.     Hernia: No hernia is present.  Musculoskeletal:     Cervical back: Normal range of motion and neck supple. No rigidity.     Right lower leg: No edema.     Left lower leg: No edema.  Lymphadenopathy:     Cervical: No cervical adenopathy.  Skin:    General: Skin is warm and dry.     Findings: No rash.  Neurological:     General: No focal deficit present.     Mental Status: She is alert. Mental status is at baseline.     Comments:  3/3 recall 5/5 calculation DLROW  Psychiatric:        Mood and Affect: Mood normal.        Behavior: Behavior normal.       Results for orders placed or performed in visit on 02/24/24  Folate   Collection Time: 02/24/24  8:27 AM  Result Value Ref  Range   Folate 11.0 >5.9 ng/mL  IBC panel   Collection Time: 02/24/24  8:27 AM  Result Value Ref Range   Iron 60 42 - 145 ug/dL   Transferrin 161.0 960.4 - 360.0 mg/dL   Saturation Ratios 54.0 (L) 20.0 - 50.0 %   TIBC 401.8 250.0 - 450.0 mcg/dL  Ferritin   Collection Time: 02/24/24  8:27 AM  Result Value Ref Range   Ferritin 6.1 (L) 10.0 - 291.0 ng/mL  Vitamin B12   Collection Time: 02/24/24  8:27 AM  Result Value Ref Range   Vitamin B-12 220 211 - 911 pg/mL  CBC with Differential/Platelet   Collection Time: 02/24/24  8:27 AM  Result Value Ref Range   WBC 6.1 4.0 - 10.5 K/uL   RBC 4.38 3.87 - 5.11 Mil/uL   Hemoglobin 12.5 12.0 - 15.0 g/dL   HCT 98.1 19.1 - 47.8 %   MCV 87.7 78.0 - 100.0 fl   MCHC 32.4 30.0 - 36.0 g/dL   RDW 29.5 62.1 - 30.8 %   Platelets 283.0 150.0 - 400.0 K/uL   Neutrophils Relative % 53.5 43.0 - 77.0 %   Lymphocytes Relative 32.8 12.0 - 46.0 %   Monocytes Relative 9.7 3.0 - 12.0 %   Eosinophils Relative 2.9 0.0 - 5.0 %   Basophils Relative 1.1 0.0 - 3.0 %   Neutro Abs 3.3 1.4 - 7.7 K/uL   Lymphs Abs 2.0 0.7 - 4.0 K/uL   Monocytes Absolute 0.6 0.1 - 1.0 K/uL   Eosinophils Absolute 0.2 0.0 - 0.7 K/uL   Basophils Absolute  0.1 0.0 - 0.1 K/uL  Hemoglobin A1c   Collection Time: 02/24/24  8:27 AM  Result Value Ref Range   Hgb A1c MFr Bld 5.4 4.6 - 6.5 %  TSH   Collection Time: 02/24/24  8:27 AM  Result Value Ref Range   TSH 2.42 0.35 - 5.50 uIU/mL  Comprehensive metabolic panel with GFR   Collection Time: 02/24/24  8:27 AM  Result Value Ref Range   Sodium 141 135 - 145 mEq/L   Potassium 4.4 3.5 - 5.1 mEq/L   Chloride 103 96 - 112 mEq/L   CO2 31 19 - 32 mEq/L   Glucose, Bld 85 70 - 99 mg/dL   BUN 18 6 - 23 mg/dL   Creatinine, Ser 1.61 0.40 - 1.20 mg/dL   Total Bilirubin 0.5 0.2 - 1.2 mg/dL   Alkaline Phosphatase 67 39 - 117 U/L   AST 13 0 - 37 U/L   ALT 11 0 - 35 U/L   Total Protein 6.5 6.0 - 8.3 g/dL   Albumin 4.0 3.5 - 5.2 g/dL   GFR 09.60  >45.40 mL/min   Calcium 9.1 8.4 - 10.5 mg/dL  Vitamin B1   Collection Time: 02/24/24  8:27 AM  Result Value Ref Range   Vitamin B1 (Thiamine) 12 8 - 30 nmol/L  VITAMIN D 25 Hydroxy (Vit-D Deficiency, Fractures)   Collection Time: 02/24/24  8:27 AM  Result Value Ref Range   VITD 26.63 (L) 30.00 - 100.00 ng/mL  Lipid panel   Collection Time: 02/24/24  8:27 AM  Result Value Ref Range   Cholesterol 191 0 - 200 mg/dL   Triglycerides 98.1 0.0 - 149.0 mg/dL   HDL 19.14 >78.29 mg/dL   VLDL 56.2 0.0 - 13.0 mg/dL   LDL Cholesterol 865 (H) 0 - 99 mg/dL   Total CHOL/HDL Ratio 3    NonHDL 122.96     Assessment & Plan:   Problem List Items Addressed This Visit     Health maintenance examination (Chronic)   Preventative protocols reviewed and updated unless pt declined. Discussed healthy diet and lifestyle.  Rec schedule GYN exam.       Medicare annual wellness visit, subsequent - Primary (Chronic)   I have personally reviewed the Medicare Annual Wellness questionnaire and have noted 1. The patient's medical and social history 2. Their use of alcohol, tobacco or illicit drugs 3. Their current medications and supplements 4. The patient's functional ability including ADL's, fall risks, home safety risks and hearing or visual impairment. Cognitive function has been assessed and addressed as indicated.  5. Diet and physical activity 6. Evidence for depression or mood disorders The patients weight, height, BMI have been recorded in the chart. I have made referrals, counseling and provided education to the patient based on review of the above and I have provided the pt with a written personalized care plan for preventive services. Provider list updated.. See scanned questionairre as needed for further documentation. Reviewed preventative protocols and updated unless pt declined.       Advanced directives, counseling/discussion (Chronic)   Advanced directive discussion - does not have.  Discussed. Packet previously provided. Husband Richard then niece Soyla Murphy would be HCPOA.       Obesity, Class I, BMI 30-34.9   S/p sleeve gastrectomy 2015 Continue to encourage healthy diet and lifestyle choices to affect sustainable weight loss.       S/P laparoscopic sleeve gastrectomy   MDD (major depressive disorder), recurrent episode, moderate (HCC)  Followed by psychiatry      GERD (gastroesophageal reflux disease)   Worsening despite pepcid. Start omeprazole 40mg  daily x 3 wks then PRN.  S/p remote EGD.  Let us know if ongoing symptoms despite above.       Relevant Medications   omeprazole (PRILOSEC) 40 MG capsule   ADD (attention deficit disorder)   Followed by psychiatrist.      Glaucoma   Regularly sees eye doctor      Relevant Medications   Bimatoprost (LUMIGAN OP)   Essential hypertension   Chronic, stable period on amlodipine 10mg  daily - continue       Relevant Medications   amLODipine (NORVASC) 10 MG tablet   Vitamin D deficiency   Rec restart 1000 units daily.       Vitamin B12 deficiency   Levels remain low normal s/p sleeve gastrectomy.  B12 shot today Rec restart daily vit B12 replacement or bariatric MVI with B12.       Iron deficiency   % sat and ferritin remains low.  Rec restart MWF oral iron replacement or bariatric MVI with iron.       Osteopenia   Encouraged regular weight bearing exercise, good dietary calcium intake, regular  vit D supplementation       History of Mohs surgery for squamous cell carcinoma of skin   Refer to derm to re establish care Also has sister with h/o melanoma      Relevant Orders   Ambulatory referral to Dermatology   Systolic murmur   Again heard today, mild, pt asxs. Will continue to monitor. No prior echocardiogram      Other Visit Diagnoses       Family history of melanoma       Relevant Orders   Ambulatory referral to Dermatology        Meds ordered this encounter   Medications   amLODipine (NORVASC) 10 MG tablet    Sig: Take 1 tablet (10 mg total) by mouth daily.    Dispense:  90 tablet    Refill:  4   Multiple Vitamins-Minerals (BARIATRIC MULTIVITAMINS) CAPS    Sig: Take 1 capsule by mouth daily.   omeprazole (PRILOSEC) 40 MG capsule    Sig: Take 1 capsule (40 mg total) by mouth daily.    Dispense:  30 capsule    Refill:  3   cyanocobalamin (VITAMIN B12) injection 1,000 mcg    Orders Placed This Encounter  Procedures   Ambulatory referral to Dermatology    Referral Priority:   Routine    Referral Type:   Consultation    Referral Reason:   Specialty Services Required    Requested Specialty:   Dermatology    Number of Visits Requested:   1    Patient Instructions  Start omeprazole 40mg  daily - sent to pharmacy.  B12 shot today  Start bariatric multivitamin.  Continue vitamin D 1000 units daily as your levels remain low.  You will be due for colonoscopy 07/2024.  Schedule GYN exam.  We will refer you to dermatology Terri Piedra) Good to see you today Return as needed or in 1 year for next physical.   Follow up plan: Return in about 1 year (around 03/11/2025) for annual exam, prior fasting for blood work, medicare wellness visit.  Eustaquio Boyden, MD

## 2024-03-11 NOTE — Assessment & Plan Note (Addendum)
 Worsening despite pepcid. Start omeprazole 40mg  daily x 3 wks then PRN.  S/p remote EGD.  Let us  know if ongoing symptoms despite above.

## 2024-04-05 ENCOUNTER — Other Ambulatory Visit (HOSPITAL_COMMUNITY): Payer: Self-pay

## 2024-04-06 ENCOUNTER — Other Ambulatory Visit (HOSPITAL_COMMUNITY): Payer: Self-pay

## 2024-05-05 ENCOUNTER — Other Ambulatory Visit: Payer: Self-pay

## 2024-05-05 ENCOUNTER — Other Ambulatory Visit (HOSPITAL_COMMUNITY): Payer: Self-pay

## 2024-05-05 MED ORDER — DEXTROAMPHETAMINE SULFATE 10 MG PO TABS
ORAL_TABLET | ORAL | 0 refills | Status: DC
  Filled 2024-05-05: qty 360, 90d supply, fill #0
  Filled 2024-05-15: qty 120, 30d supply, fill #0
  Filled 2024-07-06: qty 120, 30d supply, fill #1
  Filled 2024-09-12: qty 120, 30d supply, fill #2

## 2024-05-06 ENCOUNTER — Other Ambulatory Visit: Payer: Self-pay

## 2024-05-06 ENCOUNTER — Encounter: Payer: Self-pay | Admitting: Pharmacist

## 2024-05-13 ENCOUNTER — Other Ambulatory Visit (HOSPITAL_COMMUNITY): Payer: Self-pay

## 2024-05-13 MED ORDER — NALTREXONE HCL 50 MG PO TABS
50.0000 mg | ORAL_TABLET | Freq: Every day | ORAL | 3 refills | Status: AC
  Filled 2024-05-13 – 2024-07-30 (×4): qty 90, 90d supply, fill #0
  Filled 2024-11-11: qty 90, 90d supply, fill #1

## 2024-05-17 ENCOUNTER — Other Ambulatory Visit: Payer: Self-pay

## 2024-05-17 ENCOUNTER — Other Ambulatory Visit (HOSPITAL_COMMUNITY): Payer: Self-pay

## 2024-05-18 ENCOUNTER — Other Ambulatory Visit: Payer: Self-pay

## 2024-05-18 DIAGNOSIS — L538 Other specified erythematous conditions: Secondary | ICD-10-CM | POA: Diagnosis not present

## 2024-05-18 DIAGNOSIS — L821 Other seborrheic keratosis: Secondary | ICD-10-CM | POA: Diagnosis not present

## 2024-05-18 DIAGNOSIS — L578 Other skin changes due to chronic exposure to nonionizing radiation: Secondary | ICD-10-CM | POA: Diagnosis not present

## 2024-05-18 DIAGNOSIS — L820 Inflamed seborrheic keratosis: Secondary | ICD-10-CM | POA: Diagnosis not present

## 2024-05-18 DIAGNOSIS — L814 Other melanin hyperpigmentation: Secondary | ICD-10-CM | POA: Diagnosis not present

## 2024-05-18 DIAGNOSIS — D2362 Other benign neoplasm of skin of left upper limb, including shoulder: Secondary | ICD-10-CM | POA: Diagnosis not present

## 2024-06-04 ENCOUNTER — Other Ambulatory Visit: Payer: Self-pay

## 2024-06-04 ENCOUNTER — Other Ambulatory Visit (HOSPITAL_COMMUNITY): Payer: Self-pay

## 2024-06-22 DIAGNOSIS — H40023 Open angle with borderline findings, high risk, bilateral: Secondary | ICD-10-CM | POA: Diagnosis not present

## 2024-06-22 DIAGNOSIS — H40053 Ocular hypertension, bilateral: Secondary | ICD-10-CM | POA: Diagnosis not present

## 2024-06-22 DIAGNOSIS — H10812 Pingueculitis, left eye: Secondary | ICD-10-CM | POA: Diagnosis not present

## 2024-06-22 DIAGNOSIS — H25813 Combined forms of age-related cataract, bilateral: Secondary | ICD-10-CM | POA: Diagnosis not present

## 2024-06-28 ENCOUNTER — Other Ambulatory Visit: Payer: Self-pay | Admitting: Family Medicine

## 2024-06-28 DIAGNOSIS — K219 Gastro-esophageal reflux disease without esophagitis: Secondary | ICD-10-CM

## 2024-06-28 NOTE — Telephone Encounter (Signed)
 Per 03/11/24 note:  GERD (gastroesophageal reflux disease)     Worsening despite pepcid. Start omeprazole  40mg  daily x 3 wks then PRN.  S/p remote EGD.  Let us  know if ongoing symptoms despite above.      Left message to return call to our office.

## 2024-06-29 ENCOUNTER — Other Ambulatory Visit (HOSPITAL_COMMUNITY): Payer: Self-pay

## 2024-06-29 MED ORDER — OMEPRAZOLE 40 MG PO CPDR
40.0000 mg | DELAYED_RELEASE_CAPSULE | Freq: Every day | ORAL | 2 refills | Status: AC
  Filled 2024-06-29 – 2024-07-02 (×2): qty 90, 90d supply, fill #0
  Filled 2024-09-29: qty 90, 90d supply, fill #1

## 2024-06-29 NOTE — Telephone Encounter (Signed)
 Spoke with pt asking about sxs. States taking med daily is really helping.   E-scribed refill.

## 2024-07-02 ENCOUNTER — Other Ambulatory Visit: Payer: Self-pay

## 2024-07-02 ENCOUNTER — Other Ambulatory Visit (HOSPITAL_COMMUNITY): Payer: Self-pay

## 2024-07-05 ENCOUNTER — Other Ambulatory Visit (HOSPITAL_COMMUNITY): Payer: Self-pay

## 2024-07-06 ENCOUNTER — Other Ambulatory Visit (HOSPITAL_COMMUNITY): Payer: Self-pay

## 2024-07-06 ENCOUNTER — Other Ambulatory Visit: Payer: Self-pay

## 2024-07-06 ENCOUNTER — Encounter: Payer: Self-pay | Admitting: Pharmacist

## 2024-07-09 ENCOUNTER — Other Ambulatory Visit: Payer: Self-pay

## 2024-07-22 DIAGNOSIS — M65351 Trigger finger, right little finger: Secondary | ICD-10-CM | POA: Diagnosis not present

## 2024-07-22 DIAGNOSIS — M65341 Trigger finger, right ring finger: Secondary | ICD-10-CM | POA: Diagnosis not present

## 2024-07-30 ENCOUNTER — Other Ambulatory Visit: Payer: Self-pay

## 2024-08-05 ENCOUNTER — Other Ambulatory Visit: Payer: Self-pay | Admitting: Family Medicine

## 2024-08-05 DIAGNOSIS — Z1231 Encounter for screening mammogram for malignant neoplasm of breast: Secondary | ICD-10-CM

## 2024-08-16 DIAGNOSIS — M65341 Trigger finger, right ring finger: Secondary | ICD-10-CM | POA: Diagnosis not present

## 2024-08-16 DIAGNOSIS — M65351 Trigger finger, right little finger: Secondary | ICD-10-CM | POA: Diagnosis not present

## 2024-08-18 ENCOUNTER — Ambulatory Visit
Admission: RE | Admit: 2024-08-18 | Discharge: 2024-08-18 | Disposition: A | Discharge status: Home / Self Care | Source: Ambulatory Visit

## 2024-08-18 DIAGNOSIS — Z1231 Encounter for screening mammogram for malignant neoplasm of breast: Secondary | ICD-10-CM

## 2024-08-20 ENCOUNTER — Ambulatory Visit: Payer: Self-pay | Admitting: Family Medicine

## 2024-09-05 ENCOUNTER — Other Ambulatory Visit (HOSPITAL_COMMUNITY): Payer: Self-pay

## 2024-09-13 ENCOUNTER — Other Ambulatory Visit: Payer: Self-pay

## 2024-09-13 ENCOUNTER — Other Ambulatory Visit (HOSPITAL_COMMUNITY): Payer: Self-pay

## 2024-09-13 DIAGNOSIS — M65341 Trigger finger, right ring finger: Secondary | ICD-10-CM | POA: Diagnosis not present

## 2024-09-13 DIAGNOSIS — Z4789 Encounter for other orthopedic aftercare: Secondary | ICD-10-CM | POA: Diagnosis not present

## 2024-09-13 DIAGNOSIS — M65351 Trigger finger, right little finger: Secondary | ICD-10-CM | POA: Diagnosis not present

## 2024-09-13 MED ORDER — OXYCODONE HCL 5 MG PO TABS
5.0000 mg | ORAL_TABLET | ORAL | 0 refills | Status: AC
  Filled 2024-09-13: qty 42, 7d supply, fill #0

## 2024-09-13 MED ORDER — DEXTROAMPHETAMINE SULFATE 10 MG PO TABS
20.0000 mg | ORAL_TABLET | Freq: Two times a day (BID) | ORAL | 0 refills | Status: AC
  Filled 2024-09-13: qty 120, 30d supply, fill #0

## 2024-09-13 MED ORDER — DOXYCYCLINE HYCLATE 100 MG PO CAPS
100.0000 mg | ORAL_CAPSULE | Freq: Two times a day (BID) | ORAL | 0 refills | Status: AC
  Filled 2024-09-13: qty 14, 7d supply, fill #0

## 2024-09-28 DIAGNOSIS — M65341 Trigger finger, right ring finger: Secondary | ICD-10-CM | POA: Diagnosis not present

## 2024-09-28 DIAGNOSIS — M65351 Trigger finger, right little finger: Secondary | ICD-10-CM | POA: Diagnosis not present

## 2024-09-29 ENCOUNTER — Other Ambulatory Visit (HOSPITAL_COMMUNITY): Payer: Self-pay

## 2024-10-04 DIAGNOSIS — M65341 Trigger finger, right ring finger: Secondary | ICD-10-CM | POA: Diagnosis not present

## 2024-10-04 DIAGNOSIS — M65351 Trigger finger, right little finger: Secondary | ICD-10-CM | POA: Diagnosis not present

## 2024-10-25 DIAGNOSIS — M65351 Trigger finger, right little finger: Secondary | ICD-10-CM | POA: Diagnosis not present

## 2024-10-25 DIAGNOSIS — M65341 Trigger finger, right ring finger: Secondary | ICD-10-CM | POA: Diagnosis not present

## 2024-10-26 ENCOUNTER — Other Ambulatory Visit (HOSPITAL_COMMUNITY): Payer: Self-pay

## 2024-10-26 ENCOUNTER — Other Ambulatory Visit: Payer: Self-pay

## 2024-11-05 ENCOUNTER — Other Ambulatory Visit (HOSPITAL_COMMUNITY): Payer: Self-pay

## 2024-11-05 ENCOUNTER — Other Ambulatory Visit: Payer: Self-pay

## 2024-11-05 MED ORDER — DULOXETINE HCL 60 MG PO CPEP
60.0000 mg | ORAL_CAPSULE | Freq: Every day | ORAL | 3 refills | Status: AC
  Filled 2024-11-05 – 2024-12-17 (×2): qty 90, 90d supply, fill #0

## 2024-11-05 MED ORDER — BUPROPION HCL ER (XL) 150 MG PO TB24
450.0000 mg | ORAL_TABLET | Freq: Every morning | ORAL | 3 refills | Status: AC
  Filled 2024-11-05: qty 270, 90d supply, fill #0

## 2024-11-05 MED FILL — Dextroamphetamine Sulfate Tab 10 MG: ORAL | 90 days supply | Qty: 360 | Fill #0 | Status: CN

## 2024-11-07 ENCOUNTER — Other Ambulatory Visit (HOSPITAL_COMMUNITY): Payer: Self-pay

## 2024-11-08 DIAGNOSIS — M65351 Trigger finger, right little finger: Secondary | ICD-10-CM | POA: Diagnosis not present

## 2024-11-08 DIAGNOSIS — M65341 Trigger finger, right ring finger: Secondary | ICD-10-CM | POA: Diagnosis not present

## 2024-11-09 ENCOUNTER — Other Ambulatory Visit: Payer: Self-pay

## 2024-11-10 ENCOUNTER — Other Ambulatory Visit: Payer: Self-pay

## 2024-11-11 ENCOUNTER — Other Ambulatory Visit: Payer: Self-pay

## 2024-11-11 ENCOUNTER — Other Ambulatory Visit (HOSPITAL_COMMUNITY): Payer: Self-pay

## 2024-11-11 MED FILL — Dextroamphetamine Sulfate Tab 10 MG: ORAL | 29 days supply | Qty: 115 | Fill #0 | Status: AC

## 2024-12-17 ENCOUNTER — Other Ambulatory Visit: Payer: Self-pay

## 2024-12-17 ENCOUNTER — Other Ambulatory Visit (HOSPITAL_COMMUNITY): Payer: Self-pay

## 2025-03-07 ENCOUNTER — Other Ambulatory Visit

## 2025-03-14 ENCOUNTER — Encounter: Admitting: Family Medicine
